# Patient Record
Sex: Female | Born: 1985 | Race: White | Hispanic: No | Marital: Married | Smoking: Never smoker
Health system: Southern US, Community
[De-identification: ages and names within clinical notes are randomized; demographics above are authoritative.]

## PROBLEM LIST (undated history)

## (undated) ENCOUNTER — Inpatient Hospital Stay (HOSPITAL_COMMUNITY): Payer: Self-pay

## (undated) DIAGNOSIS — Z789 Other specified health status: Secondary | ICD-10-CM

## (undated) DIAGNOSIS — K631 Perforation of intestine (nontraumatic): Secondary | ICD-10-CM

## (undated) DIAGNOSIS — Z9889 Other specified postprocedural states: Secondary | ICD-10-CM

## (undated) DIAGNOSIS — Z87440 Personal history of urinary (tract) infections: Secondary | ICD-10-CM

## (undated) HISTORY — DX: Personal history of urinary (tract) infections: Z87.440

---

## 2006-08-21 HISTORY — PX: APPENDECTOMY: SHX54

## 2011-10-13 ENCOUNTER — Other Ambulatory Visit (HOSPITAL_COMMUNITY): Payer: Self-pay | Admitting: Orthopedic Surgery

## 2011-10-13 DIAGNOSIS — M25561 Pain in right knee: Secondary | ICD-10-CM

## 2011-10-14 ENCOUNTER — Ambulatory Visit (HOSPITAL_COMMUNITY)
Admission: RE | Admit: 2011-10-14 | Discharge: 2011-10-14 | Disposition: A | Discharge status: Home / Self Care | Payer: 59 | Source: Ambulatory Visit | Attending: Orthopedic Surgery | Admitting: Orthopedic Surgery

## 2011-10-14 DIAGNOSIS — Y9323 Activity, snow (alpine) (downhill) skiing, snow boarding, sledding, tobogganing and snow tubing: Secondary | ICD-10-CM | POA: Insufficient documentation

## 2011-10-14 DIAGNOSIS — S82109A Unspecified fracture of upper end of unspecified tibia, initial encounter for closed fracture: Secondary | ICD-10-CM | POA: Insufficient documentation

## 2011-10-14 DIAGNOSIS — X58XXXA Exposure to other specified factors, initial encounter: Secondary | ICD-10-CM | POA: Insufficient documentation

## 2011-10-14 DIAGNOSIS — M25561 Pain in right knee: Secondary | ICD-10-CM

## 2011-10-14 DIAGNOSIS — M25469 Effusion, unspecified knee: Secondary | ICD-10-CM | POA: Insufficient documentation

## 2011-10-14 DIAGNOSIS — M7120 Synovial cyst of popliteal space [Baker], unspecified knee: Secondary | ICD-10-CM | POA: Insufficient documentation

## 2011-10-14 DIAGNOSIS — M25569 Pain in unspecified knee: Secondary | ICD-10-CM | POA: Insufficient documentation

## 2011-10-20 ENCOUNTER — Other Ambulatory Visit (HOSPITAL_COMMUNITY): Payer: Self-pay

## 2015-12-15 ENCOUNTER — Encounter (HOSPITAL_COMMUNITY): Payer: Self-pay | Admitting: *Deleted

## 2015-12-15 ENCOUNTER — Inpatient Hospital Stay (HOSPITAL_COMMUNITY)
Admission: AD | Admit: 2015-12-15 | Discharge: 2015-12-15 | Disposition: A | Discharge status: Home / Self Care | Payer: BLUE CROSS/BLUE SHIELD | Source: Ambulatory Visit | Attending: Obstetrics & Gynecology | Admitting: Obstetrics & Gynecology

## 2015-12-15 DIAGNOSIS — Z9049 Acquired absence of other specified parts of digestive tract: Secondary | ICD-10-CM | POA: Insufficient documentation

## 2015-12-15 DIAGNOSIS — O26851 Spotting complicating pregnancy, first trimester: Secondary | ICD-10-CM | POA: Insufficient documentation

## 2015-12-15 DIAGNOSIS — Z3A01 Less than 8 weeks gestation of pregnancy: Secondary | ICD-10-CM | POA: Insufficient documentation

## 2015-12-15 DIAGNOSIS — O209 Hemorrhage in early pregnancy, unspecified: Secondary | ICD-10-CM | POA: Diagnosis present

## 2015-12-15 HISTORY — DX: Other specified health status: Z78.9

## 2015-12-15 LAB — TYPE AND SCREEN
ABO/RH(D): O POS
ANTIBODY SCREEN: NEGATIVE

## 2015-12-15 LAB — ABO/RH: ABO/RH(D): O POS

## 2015-12-15 NOTE — H&P (Signed)
Chief complaint: Vaginal bleeding in pregnancy  History of present illness: 30 year old G1 at approximate [redacted] weeks gestational age by sure last menstrual period and history of normal menses who presents with brown spotting started on Friday, spotting continued slightly with some mild cramping on Saturday and woke up today passing a bright red clot. Some pink spotting since then and very minimal cramping. Patient concerned about possible miscarriage. Planned and desired first pregnancy.   Patient with prior appendectomy but no other pelvic surgeries. No history of gonorrhea Chlamydia pelvic inflammatory disease. No history of infertility.  Patient notes no chest pain, no shortness of breath no significant abdominal pain, no fevers.   Past medical history: None Past surgical history: Appendectomy Allergies: None Medications: Prenatal vitamin  Physical exam Filed Vitals:   12/15/15 0903 12/15/15 0906  BP:  123/78  Pulse:  94  Temp:  98.7 F (37.1 C)  TempSrc:  Oral  Resp:  16  Height: 5\' 7"  (1.702 m)   Weight: 58.324 kg (128 lb 9.3 oz)    Gen.: Well-appearing, no distress, slightly anxious CV: Regular rate and rhythm Pulmonary: Clear to auscultation bilaterally Back: No costovertebral angle tenderness Abdomen: Soft, nontender, nondistended, no right upper quadrant pain, no rebound, no guarding GU: Normal bilaterally of lateral labia majora and minora, normal mons normal hair distribution, normal introitus, normal perineum without swelling or rashes, normal vagina, cervix closed long, no cervical motion tenderness, uterus small and nontender, no adnexal masses or adnexal tenderness, small amount of dark brown blood in the vagina and at the cervical os Lower extremities: Nontender, no edema  Real-time transvaginal ultrasound: No adnexal masses, no free fluid, uterus with gestational yolk sac and intrauterine pregnancy with active FH. Due to my unfamiliarity with the ultrasound machine was  unable to document fetal pole size or heart rate but both appear appropriate for [redacted] weeks gestation  Type and screen: Pending  Assessment and plan: 30 year old G1 at 7 weeks by last menstrual period presents with small amount of brown spotting after passing large red clot earlier today. - Viable IUP with yolk sac fetal pole and active fetal heartbeat. Patient reassured. Patient to keep her planned follow-up in the office in one week for formal dating ultrasound.  - Type and screen pending. Will call patient later today if she is in need of program. Patient is also to follow-up with her blood type by call the office tomorrow - No evidence of anemia  Graycie Halley A. 12/15/2015 9:50 AM

## 2015-12-15 NOTE — MAU Note (Signed)
Urine sent to lab 

## 2015-12-15 NOTE — MAU Note (Signed)
Pt states the spotting started Friday.  Pt states the cramping started yesterday afternoon.  Pt states the cramping is mild.  Pt describes the spotting as dark brown and states the cramping has gotten slightly worse this morning.

## 2015-12-15 NOTE — Discharge Instructions (Signed)

## 2016-02-18 ENCOUNTER — Encounter: Payer: Self-pay | Admitting: Gastroenterology

## 2016-03-10 ENCOUNTER — Other Ambulatory Visit: Payer: Self-pay | Admitting: Gastroenterology

## 2016-03-16 ENCOUNTER — Other Ambulatory Visit: Payer: Self-pay | Admitting: Gastroenterology

## 2016-03-16 DIAGNOSIS — R109 Unspecified abdominal pain: Secondary | ICD-10-CM

## 2016-03-23 ENCOUNTER — Other Ambulatory Visit: Payer: 59

## 2016-03-27 ENCOUNTER — Ambulatory Visit
Admission: RE | Admit: 2016-03-27 | Discharge: 2016-03-27 | Disposition: A | Discharge status: Home / Self Care | Payer: BLUE CROSS/BLUE SHIELD | Source: Ambulatory Visit | Attending: Gastroenterology | Admitting: Gastroenterology

## 2016-03-27 ENCOUNTER — Encounter: Payer: Self-pay | Admitting: Radiology

## 2016-03-27 DIAGNOSIS — R109 Unspecified abdominal pain: Secondary | ICD-10-CM

## 2016-03-27 MED ORDER — IOPAMIDOL (ISOVUE-300) INJECTION 61%
100.0000 mL | Freq: Once | INTRAVENOUS | Status: AC | PRN
  Administered 2016-03-27: 100 mL via INTRAVENOUS

## 2016-04-06 ENCOUNTER — Ambulatory Visit: Payer: 59 | Admitting: Gastroenterology

## 2016-05-14 LAB — OB RESULTS CONSOLE HEPATITIS B SURFACE ANTIGEN: Hepatitis B Surface Ag: NEGATIVE

## 2016-05-14 LAB — OB RESULTS CONSOLE HIV ANTIBODY (ROUTINE TESTING): HIV: NONREACTIVE

## 2016-05-14 LAB — OB RESULTS CONSOLE RUBELLA ANTIBODY, IGM: Rubella: IMMUNE

## 2016-05-14 LAB — OB RESULTS CONSOLE RPR: RPR: NONREACTIVE

## 2016-05-27 LAB — OB RESULTS CONSOLE GC/CHLAMYDIA
Chlamydia: NEGATIVE
GC PROBE AMP, GENITAL: NEGATIVE

## 2016-09-11 ENCOUNTER — Encounter (HOSPITAL_COMMUNITY): Payer: Self-pay

## 2016-09-11 ENCOUNTER — Inpatient Hospital Stay (HOSPITAL_COMMUNITY)
Admission: AD | Admit: 2016-09-11 | Discharge: 2016-09-11 | Disposition: A | Discharge status: Home / Self Care | Payer: BLUE CROSS/BLUE SHIELD | Source: Ambulatory Visit | Attending: Obstetrics and Gynecology | Admitting: Obstetrics and Gynecology

## 2016-09-11 DIAGNOSIS — Z3A25 25 weeks gestation of pregnancy: Secondary | ICD-10-CM | POA: Insufficient documentation

## 2016-09-11 DIAGNOSIS — R1110 Vomiting, unspecified: Secondary | ICD-10-CM | POA: Diagnosis present

## 2016-09-11 DIAGNOSIS — O99612 Diseases of the digestive system complicating pregnancy, second trimester: Secondary | ICD-10-CM | POA: Insufficient documentation

## 2016-09-11 DIAGNOSIS — A084 Viral intestinal infection, unspecified: Secondary | ICD-10-CM | POA: Insufficient documentation

## 2016-09-11 MED ORDER — PROMETHAZINE HCL 25 MG PO TABS: 12.5000 mg | ORAL_TABLET | Freq: Four times a day (QID) | ORAL | 0 refills | Status: DC | PRN

## 2016-09-11 MED ORDER — PROMETHAZINE HCL 25 MG/ML IJ SOLN
25.0000 mg | Freq: Once | INTRAVENOUS | Status: AC
  Administered 2016-09-11: 25 mg via INTRAVENOUS
  Filled 2016-09-11: qty 1

## 2016-09-11 MED ORDER — ONDANSETRON 8 MG PO TBDP: 8.0000 mg | ORAL_TABLET | Freq: Three times a day (TID) | ORAL | 0 refills | Status: DC | PRN

## 2016-09-11 MED ORDER — LACTATED RINGERS IV BOLUS (SEPSIS)
1000.0000 mL | Freq: Once | INTRAVENOUS | Status: AC
  Administered 2016-09-11: 1000 mL via INTRAVENOUS

## 2016-09-11 NOTE — Discharge Instructions (Signed)

## 2016-09-11 NOTE — MAU Provider Note (Signed)
  History     CSN: [id number]  Arrival date and time: 09/11/16 [id number]   First Provider Initiated Contact with Patient 09/11/16 0231      Chief Complaint  Patient presents with  . Emesis   Lauren Zhang is a 30 y.o. G2P0010 at 1536w5d who presents today with nausea/vomiting/diarrhea. She denies any VB or LOF. She reports normal fetal movement. She denies any complications with this pregnancy. Next appointment 09/24/15.    Emesis   This is a new problem. The current episode started yesterday. The problem occurs more than 10 times per day. The problem has been unchanged. The emesis has an appearance of stomach contents. The maximum temperature recorded prior to her arrival was 100.4 - 100.9 F. The fever has been present for less than 1 day. Associated symptoms include abdominal pain. Pertinent negatives include no chills, diarrhea or fever. 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 She has tried nothing for the symptoms.   Past Medical History:  Diagnosis Date  . Medical history non-contributory     Past Surgical History:  Procedure Laterality Date  . APPENDECTOMY      History reviewed. No pertinent family history.  Social History  Substance Use Topics  . Smoking status: Never Smoker  . Smokeless tobacco: Never Used  . Alcohol use No    Allergies: No Known Allergies  Prescriptions Prior to Admission  Medication Sig Dispense Refill Last Dose  . Prenatal Vit-Fe Fumarate-FA (PRENATAL MULTIVITAMIN) TABS tablet Take 1 tablet by mouth daily at 12 noon.   12/14/2015 at Unknown time    Review of Systems  Constitutional: Negative for chills and fever.  Gastrointestinal: Positive for abdominal pain, nausea and vomiting. Negative for constipation and diarrhea.  Genitourinary: Negative for dysuria, frequency and urgency.   Physical Exam   Blood pressure 102/65, pulse 101, temperature 98.4 F (36.9 C), temperature source Oral, resp. rate 18, last menstrual period 03/15/2016, unknown if  currently breastfeeding.  Physical Exam  Nursing note and vitals reviewed. Constitutional: She is oriented to person, place, and time. She appears well-developed and well-nourished. No distress.  HENT:  Head: Normocephalic.  Cardiovascular: Normal rate.   Respiratory: Effort normal.  GI: Soft. There is no tenderness. There is no rebound.  Musculoskeletal: Normal range of motion.  Neurological: She is alert and oriented to person, place, and time.  Skin: Skin is warm and dry.  Psychiatric: She has a normal mood and affect.   FHT: 150, moderate with 10x10 accels, no decels Toco: no UCs.  MAU Course  Procedures  MDM Patient has had 1L of D5LR with phenergan and 1L of LR.  0459: D/W Dr. Billy Coastaavon, ok for DC home with phenergan and zofran.  Assessment and Plan   1. Viral gastroenteritis   2. [redacted] weeks gestation of pregnancy    DC home Comfort measures reviewed  3rd Trimester precautions  PTL precautions  Fetal kick counts RX: phenergan PRN, zofran ODT PRN  Return to MAU as needed FU with OB as planned  Follow-up Information    Lenoard AdenAAVON,[name], MD Follow up.   Specialty:  Obstetrics and Gynecology Contact information: [address] [place] [address] [phone]            Tawnya CrookHogan, Damont Balles Donovan 09/11/2016, 2:34 AM

## 2016-09-11 NOTE — MAU Note (Signed)
Patient endorses still feeling nauseous following iv fluids and medication. CNM made aware. Patient offered zofran, which she denies at this time.

## 2016-09-11 NOTE — MAU Note (Signed)
Pt presents complaining of nausea and vomiting with diarrhea since 1730 last night. Denies pain. Denies leaking or bleeding. Reports good fetal movement.

## 2016-09-17 ENCOUNTER — Encounter (HOSPITAL_COMMUNITY): Payer: Self-pay | Admitting: *Deleted

## 2016-09-17 ENCOUNTER — Inpatient Hospital Stay (HOSPITAL_COMMUNITY): Payer: BLUE CROSS/BLUE SHIELD

## 2016-09-17 ENCOUNTER — Inpatient Hospital Stay (HOSPITAL_COMMUNITY)
Admission: AD | Admit: 2016-09-17 | Discharge: 2016-09-22 | DRG: 981 | Disposition: A | Discharge status: Home Health | Payer: BLUE CROSS/BLUE SHIELD | Source: Ambulatory Visit | Attending: General Surgery | Admitting: General Surgery

## 2016-09-17 DIAGNOSIS — N1330 Unspecified hydronephrosis: Secondary | ICD-10-CM | POA: Diagnosis present

## 2016-09-17 DIAGNOSIS — O99284 Endocrine, nutritional and metabolic diseases complicating childbirth: Secondary | ICD-10-CM | POA: Diagnosis present

## 2016-09-17 DIAGNOSIS — O26892 Other specified pregnancy related conditions, second trimester: Secondary | ICD-10-CM | POA: Diagnosis present

## 2016-09-17 DIAGNOSIS — Z3A27 27 weeks gestation of pregnancy: Secondary | ICD-10-CM

## 2016-09-17 DIAGNOSIS — R109 Unspecified abdominal pain: Secondary | ICD-10-CM | POA: Diagnosis not present

## 2016-09-17 DIAGNOSIS — K668 Other specified disorders of peritoneum: Secondary | ICD-10-CM | POA: Diagnosis present

## 2016-09-17 DIAGNOSIS — K5900 Constipation, unspecified: Secondary | ICD-10-CM | POA: Diagnosis present

## 2016-09-17 DIAGNOSIS — E876 Hypokalemia: Secondary | ICD-10-CM | POA: Diagnosis not present

## 2016-09-17 DIAGNOSIS — O99612 Diseases of the digestive system complicating pregnancy, second trimester: Secondary | ICD-10-CM | POA: Diagnosis not present

## 2016-09-17 DIAGNOSIS — Z09 Encounter for follow-up examination after completed treatment for conditions other than malignant neoplasm: Secondary | ICD-10-CM

## 2016-09-17 DIAGNOSIS — K631 Perforation of intestine (nontraumatic): Secondary | ICD-10-CM | POA: Diagnosis present

## 2016-09-17 LAB — CBC
HCT: 33.9 % — ABNORMAL LOW (ref 36.0–46.0)
Hemoglobin: 12 g/dL (ref 12.0–15.0)
MCH: 31.3 pg (ref 26.0–34.0)
MCHC: 35.4 g/dL (ref 30.0–36.0)
MCV: 88.3 fL (ref 78.0–100.0)
Platelets: 270 10*3/uL (ref 150–400)
RBC: 3.84 MIL/uL — ABNORMAL LOW (ref 3.87–5.11)
RDW: 13 % (ref 11.5–15.5)
WBC: 14.4 10*3/uL — ABNORMAL HIGH (ref 4.0–10.5)

## 2016-09-17 LAB — URINALYSIS, ROUTINE W REFLEX MICROSCOPIC
BILIRUBIN URINE: NEGATIVE
Glucose, UA: NEGATIVE mg/dL
Hgb urine dipstick: NEGATIVE
KETONES UR: NEGATIVE mg/dL
LEUKOCYTES UA: NEGATIVE
NITRITE: NEGATIVE
PH: 7 (ref 5.0–8.0)
PROTEIN: NEGATIVE mg/dL
Specific Gravity, Urine: 1.006 (ref 1.005–1.030)

## 2016-09-17 MED ORDER — PROMETHAZINE HCL 25 MG/ML IJ SOLN
12.5000 mg | Freq: Once | INTRAMUSCULAR | Status: AC
  Administered 2016-09-17: 12.5 mg via INTRAMUSCULAR
  Filled 2016-09-17: qty 1

## 2016-09-17 NOTE — MAU Provider Note (Signed)
History     CSN: [id number]  Arrival date and time: 09/17/16 2038   First Provider Initiated Contact with Patient 09/17/16 2121      Chief Complaint  Patient presents with  . Abdominal Pain   Lauren Zhang is a 30 y.o. G2P0010 at [redacted]w[redacted]d who presents today with abdominal pain. She was seen here with viral gastroenteritis last week. She states that she has recovered from that, but she has noticed increasing constipation over the last few days. She went to work this morning, and she had abdominal pain. She was seen on L&D at [facility], and had an enema. She had some results, but she has continued to have pain. She was checked at [facility] this morning as well. She reports that she was told she was closed. Prior to this BM after enema today she has not had an "normal" BM in about 3 days. She usually has 2 BMs per day. She denies any contractions, vaginal bleeding or LOF. She confirms normal fetal movement.    Abdominal Pain  This is a new problem. The current episode started today. The onset quality is sudden. The problem occurs constantly. The problem has been unchanged. The pain is located in the LLQ. The pain is severe. The quality of the pain is cramping and sharp. The abdominal pain does not radiate. Nothing aggravates the pain. The pain is relieved by bowel movements. Treatments tried: enema, and laxatives  The treatment provided mild relief.   Past Medical History:  Diagnosis Date  . Medical history non-contributory     Past Surgical History:  Procedure Laterality Date  . APPENDECTOMY      History reviewed. No pertinent family history.  Social History  Substance Use Topics  . Smoking status: Never Smoker  . Smokeless tobacco: Never Used  . Alcohol use No   Allergies: No Known Allergies  Prescriptions Prior to Admission  Medication Sig Dispense Refill Last Dose  . acetaminophen (TYLENOL) 500 MG tablet Take 1,000 mg by mouth every 6 (six) hours as needed for mild pain.    09/17/2016 at Unknown time  . bisacodyl (DULCOLAX) 10 MG suppository Place 10 mg rectally once.   09/17/2016 at Unknown time  . bisacodyl (DULCOLAX) 5 MG EC tablet Take 5 mg by mouth once.   09/17/2016 at Unknown time  . polyethylene glycol (MIRALAX / GLYCOLAX) packet Take 17 g by mouth once.   09/17/2016 at Unknown time  . Prenatal Vit-Fe Fumarate-FA (PRENATAL MULTIVITAMIN) TABS tablet Take 1 tablet by mouth daily at 12 noon.   09/17/2016 at Unknown time  . ondansetron (ZOFRAN ODT) 8 MG disintegrating tablet Take 1 tablet (8 mg total) by mouth every 8 (eight) hours as needed for nausea or vomiting. (Patient not taking: Reported on 09/17/2016) 20 tablet 0 Not Taking at Unknown time  . promethazine (PHENERGAN) 25 MG tablet Take 0.5-1 tablets (12.5-25 mg total) by mouth every 6 (six) hours as needed. (Patient not taking: Reported on 09/17/2016) 30 tablet 0 Not Taking at Unknown time    Review of Systems  Gastrointestinal: Positive for abdominal pain.   Physical Exam   Blood pressure 105/61, pulse 91, temperature 98.1 F (36.7 C), temperature source Oral, resp. rate 18, height 5\' 7"  (1.702 m), weight 143 lb (64.9 kg), last menstrual period 03/15/2016, unknown if currently breastfeeding.  Physical Exam  Nursing note and vitals reviewed. Constitutional: She is oriented to person, place, and time. She appears well-developed and well-nourished. No distress.  HENT:  Head: Normocephalic.  Cardiovascular: Normal rate.   GI: Soft. There is tenderness (in LLQ ).  Hypoactive BS   Neurological: She is alert and oriented to person, place, and time.  Skin: Skin is warm and dry.  Psychiatric: She has a normal mood and affect.   Results for orders placed or performed during the hospital encounter of 09/17/16 (from the past 24 hour(s))  Urinalysis, Routine w reflex microscopic     Status: None   Collection Time: 09/17/16  8:52 PM  Result Value Ref Range   Color, Urine YELLOW YELLOW   APPearance CLEAR  CLEAR   Specific Gravity, Urine 1.006 1.005 - 1.030   pH 7.0 5.0 - 8.0   Glucose, UA NEGATIVE NEGATIVE mg/dL   Hgb urine dipstick NEGATIVE NEGATIVE   Bilirubin Urine NEGATIVE NEGATIVE   Ketones, ur NEGATIVE NEGATIVE mg/dL   Protein, ur NEGATIVE NEGATIVE mg/dL   Nitrite NEGATIVE NEGATIVE   Leukocytes, UA NEGATIVE NEGATIVE  CBC     Status: Abnormal   Collection Time: 09/17/16 11:06 PM  Result Value Ref Range   WBC 14.4 (H) 4.0 - 10.5 K/uL   RBC 3.84 (L) 3.87 - 5.11 MIL/uL   Hemoglobin 12.0 12.0 - 15.0 g/dL   HCT 78.233.9 (L) 95.636.0 - 21.346.0 %   MCV 88.3 78.0 - 100.0 fL   MCH 31.3 26.0 - 34.0 pg   MCHC 35.4 30.0 - 36.0 g/dL   RDW 08.613.0 57.811.5 - 46.915.5 %   Platelets 270 150 - 400 K/uL  Comprehensive metabolic panel     Status: Abnormal (Preliminary result)   Collection Time: 09/17/16 11:06 PM  Result Value Ref Range   Sodium 136 135 - 145 mmol/L   Potassium 3.6 3.5 - 5.1 mmol/L   Chloride 106 101 - 111 mmol/L   CO2 24 22 - 32 mmol/L   Glucose, Bld 115 (H) 65 - 99 mg/dL   BUN PENDING 6 - 20 mg/dL   Creatinine, Ser 6.290.56 0.44 - 1.00 mg/dL   Calcium 8.5 (L) 8.9 - 10.3 mg/dL   Total Protein 6.5 6.5 - 8.1 g/dL   Albumin 3.6 3.5 - 5.0 g/dL   AST 36 15 - 41 U/L   ALT 74 (H) 14 - 54 U/L   Alkaline Phosphatase 50 38 - 126 U/L   Total Bilirubin 0.4 0.3 - 1.2 mg/dL   GFR calc non Af Amer >60 >60 mL/min   GFR calc Af Amer >60 >60 mL/min   Anion gap 6 5 - 15   Dg Abdomen 1 View  Result Date: 09/17/2016 CLINICAL DATA:  Lower abdominal pain since this morning. Patient is [redacted] weeks pregnant. EXAM: ABDOMEN - 1 VIEW COMPARISON:  CT 03/27/2016 FINDINGS: Suspicious for free air under the right and and possibly left hemidiaphragm. Scattered air-fluid levels within the ascending and transverse colon. No small bowel dilatation. Stable sutures at the base of the cecum from appendectomy. Gravid uterus is noted. Normal osseous structures. IMPRESSION: Suspected free air under the right and possible left  hemidiaphragm. Scattered air-fluid levels within the colon, can be seen with diarrheal illness. No evidence of bowel obstruction. Critical Value/emergent results were called by telephone at the time of interpretation on 09/17/2016 at 11:49 pm to [facility] Bright Spielmann CNM, who verbally acknowledged these results. Electronically Signed   By: Rubye OaksMelanie  Ehinger M.D.   On: 09/17/2016 23:45   MAU Course  Procedures  MDM Patient has had enema, and reports that "the only the water came out". She ire requesting an x-ray to  look for obstruction. I d/w the patient that there are no risk factor for obstruction, and that I do not feel an x-ray is necessary. She still would like one. R/B of x-ray discussed.  2258: D/W Dr. Juliene PinaMody, will get CBC, CMET and x-ray. 2357: Dr. Juliene PinaMody notified of x-ray results. She is in the OR currently doing a c-section. Will call when done.  [id number]: Dr. Juliene PinaMody on the unit to see the patient. 0103: Dr. Juliene PinaMody on the phone with general surgery  0111: Surgery recommending MRI for further evaluation  0133: [facility] and she [facility] for [facility]. [facility] if MRI is negative. If positive then [facility] will consult with surgery. Dr. Juliene PinaMody would like to be present for surgery if surgery is done.   Dr. Juliene PinaMody cell phone: [phone]  Assessment and Plan  [facility] Will wait in ED results pending.    Tawnya CrookHogan, Lechelle Wrigley Donovan 09/17/2016, 9:40 PM

## 2016-09-17 NOTE — MAU Note (Addendum)
PT SAYS SHE WORKS  AT  [employer].      LAST WEEK -   CAME  HERE  FOR  IV  FLUIDS.     TODAY    AT WORK  HAD BAD  ABD  PAIN-    AND   WORSE THIS MORN.       GAVE HER  AN ENEMA- HAD  GOOD  RESULTS  THEN SHE  TOOK MIRILAX            TOOK AGAIN X3-        PAIN MOSTLY  ON LEFT  SIDE  OF  ABD.   ALSO  SAYS HER  RIGHT  SHOULDER  STARTED  HURTING  LASST   NIGHT

## 2016-09-18 ENCOUNTER — Encounter (HOSPITAL_COMMUNITY): Payer: Self-pay | Admitting: Radiology

## 2016-09-18 ENCOUNTER — Encounter (HOSPITAL_COMMUNITY): Admission: AD | Disposition: A | Discharge status: Home Health | Payer: Self-pay | Source: Ambulatory Visit

## 2016-09-18 ENCOUNTER — Inpatient Hospital Stay (HOSPITAL_COMMUNITY): Payer: BLUE CROSS/BLUE SHIELD

## 2016-09-18 ENCOUNTER — Inpatient Hospital Stay (HOSPITAL_COMMUNITY): Payer: BLUE CROSS/BLUE SHIELD | Admitting: Certified Registered Nurse Anesthetist

## 2016-09-18 DIAGNOSIS — K668 Other specified disorders of peritoneum: Secondary | ICD-10-CM | POA: Diagnosis present

## 2016-09-18 DIAGNOSIS — O99284 Endocrine, nutritional and metabolic diseases complicating childbirth: Secondary | ICD-10-CM | POA: Diagnosis present

## 2016-09-18 DIAGNOSIS — O99612 Diseases of the digestive system complicating pregnancy, second trimester: Secondary | ICD-10-CM | POA: Diagnosis present

## 2016-09-18 DIAGNOSIS — Z3A27 27 weeks gestation of pregnancy: Secondary | ICD-10-CM | POA: Diagnosis not present

## 2016-09-18 DIAGNOSIS — K5900 Constipation, unspecified: Secondary | ICD-10-CM | POA: Diagnosis present

## 2016-09-18 DIAGNOSIS — O26892 Other specified pregnancy related conditions, second trimester: Secondary | ICD-10-CM | POA: Diagnosis present

## 2016-09-18 DIAGNOSIS — K631 Perforation of intestine (nontraumatic): Secondary | ICD-10-CM | POA: Diagnosis present

## 2016-09-18 DIAGNOSIS — R109 Unspecified abdominal pain: Secondary | ICD-10-CM | POA: Diagnosis present

## 2016-09-18 DIAGNOSIS — N1330 Unspecified hydronephrosis: Secondary | ICD-10-CM | POA: Diagnosis present

## 2016-09-18 DIAGNOSIS — E876 Hypokalemia: Secondary | ICD-10-CM | POA: Diagnosis not present

## 2016-09-18 HISTORY — PX: COLOSTOMY: SHX63

## 2016-09-18 HISTORY — PX: LAPAROSCOPY: SHX197

## 2016-09-18 HISTORY — PX: COLON RESECTION: SHX5231

## 2016-09-18 LAB — ABO/RH: ABO/RH(D): O POS

## 2016-09-18 LAB — COMPREHENSIVE METABOLIC PANEL
ALBUMIN: 3.6 g/dL (ref 3.5–5.0)
ALT: 74 U/L — AB (ref 14–54)
ANION GAP: 6 (ref 5–15)
AST: 36 U/L (ref 15–41)
Alkaline Phosphatase: 50 U/L (ref 38–126)
BUN: <5 mg/dL — ABNORMAL LOW (ref 6–20)
CHLORIDE: 106 mmol/L (ref 101–111)
CO2: 24 mmol/L (ref 22–32)
Calcium: 8.5 mg/dL — ABNORMAL LOW (ref 8.9–10.3)
Creatinine, Ser: 0.56 mg/dL (ref 0.44–1.00)
GFR calc non Af Amer: >60 mL/min (ref >60)
GLUCOSE: 115 mg/dL — AB (ref 65–99)
Potassium: 3.6 mmol/L (ref 3.5–5.1)
SODIUM: 136 mmol/L (ref 135–145)
Total Bilirubin: 0.4 mg/dL (ref 0.3–1.2)
Total Protein: 6.5 g/dL (ref 6.5–8.1)

## 2016-09-18 LAB — MRSA PCR SCREENING: MRSA BY PCR: NEGATIVE

## 2016-09-18 LAB — TYPE AND SCREEN
ABO/RH(D): O POS
ANTIBODY SCREEN: NEGATIVE

## 2016-09-18 SURGERY — LAPAROSCOPY, DIAGNOSTIC
Anesthesia: General | Site: Abdomen

## 2016-09-18 MED ORDER — SUCCINYLCHOLINE CHLORIDE 20 MG/ML IJ SOLN
INTRAMUSCULAR | Status: DC | PRN
  Administered 2016-09-18: 100 mg via INTRAVENOUS

## 2016-09-18 MED ORDER — PRENATAL MULTIVITAMIN CH
1.0000 | ORAL_TABLET | Freq: Every day | ORAL | Status: DC
  Administered 2016-09-19 – 2016-09-21 (×2): 1 via ORAL
  Filled 2016-09-18 (×5): qty 1

## 2016-09-18 MED ORDER — HYDROMORPHONE HCL 2 MG/ML IJ SOLN
1.0000 mg | Freq: Once | INTRAMUSCULAR | Status: AC
  Administered 2016-09-18: 1 mg via INTRAVENOUS
  Filled 2016-09-18: qty 1

## 2016-09-18 MED ORDER — EPHEDRINE 5 MG/ML INJ
INTRAVENOUS | Status: AC
  Filled 2016-09-18: qty 20

## 2016-09-18 MED ORDER — PANTOPRAZOLE SODIUM 40 MG IV SOLR
40.0000 mg | Freq: Two times a day (BID) | INTRAVENOUS | Status: DC
  Administered 2016-09-18 – 2016-09-20 (×4): 40 mg via INTRAVENOUS
  Filled 2016-09-18 (×4): qty 40

## 2016-09-18 MED ORDER — NEOSTIGMINE METHYLSULFATE 10 MG/10ML IV SOLN
INTRAVENOUS | Status: DC | PRN
Start: 2016-09-18 — End: 2016-09-18
  Administered 2016-09-18: 4 mg via INTRAVENOUS

## 2016-09-18 MED ORDER — LIDOCAINE HCL (CARDIAC) 20 MG/ML IV SOLN
INTRAVENOUS | Status: DC | PRN
  Administered 2016-09-18: 40 mg via INTRAVENOUS

## 2016-09-18 MED ORDER — FENTANYL CITRATE (PF) 100 MCG/2ML IJ SOLN
INTRAMUSCULAR | Status: AC
  Administered 2016-09-18: 50 ug via INTRAVENOUS
  Filled 2016-09-18: qty 2

## 2016-09-18 MED ORDER — BUPIVACAINE HCL (PF) 0.5 % IJ SOLN
INTRAMUSCULAR | Status: AC
  Filled 2016-09-18: qty 30

## 2016-09-18 MED ORDER — PIPERACILLIN-TAZOBACTAM 3.375 G IVPB 30 MIN
3.3750 g | INTRAVENOUS | Status: AC
  Administered 2016-09-18: 3.375 g via INTRAVENOUS
  Filled 2016-09-18: qty 50

## 2016-09-18 MED ORDER — IOPAMIDOL (ISOVUE-300) INJECTION 61%
INTRAVENOUS | Status: AC
  Filled 2016-09-18: qty 100

## 2016-09-18 MED ORDER — PIPERACILLIN-TAZOBACTAM 3.375 G IVPB
3.3750 g | Freq: Three times a day (TID) | INTRAVENOUS | Status: DC
  Administered 2016-09-18 – 2016-09-22 (×11): 3.375 g via INTRAVENOUS
  Filled 2016-09-18 (×14): qty 50

## 2016-09-18 MED ORDER — NEOSTIGMINE METHYLSULFATE 5 MG/5ML IV SOSY
PREFILLED_SYRINGE | INTRAVENOUS | Status: AC
  Filled 2016-09-18: qty 5

## 2016-09-18 MED ORDER — FAMOTIDINE IN NACL 20-0.9 MG/50ML-% IV SOLN
20.0000 mg | Freq: Once | INTRAVENOUS | Status: AC
  Administered 2016-09-18: 20 mg via INTRAVENOUS
  Filled 2016-09-18: qty 50

## 2016-09-18 MED ORDER — LIDOCAINE 2% (20 MG/ML) 5 ML SYRINGE
INTRAMUSCULAR | Status: AC
  Filled 2016-09-18: qty 10

## 2016-09-18 MED ORDER — PHENYLEPHRINE HCL 10 MG/ML IJ SOLN
INTRAMUSCULAR | Status: DC | PRN
Start: 2016-09-18 — End: 2016-09-18
  Administered 2016-09-18: 120 ug via INTRAVENOUS
  Administered 2016-09-18 (×2): 80 ug via INTRAVENOUS

## 2016-09-18 MED ORDER — HYDROMORPHONE HCL 1 MG/ML IJ SOLN
1.0000 mg | Freq: Once | INTRAMUSCULAR | Status: AC
  Administered 2016-09-18: 1 mg via INTRAVENOUS

## 2016-09-18 MED ORDER — ONDANSETRON HCL 4 MG/2ML IJ SOLN
INTRAMUSCULAR | Status: AC
  Administered 2016-09-18: 4 mg
  Filled 2016-09-18: qty 2

## 2016-09-18 MED ORDER — HYDROMORPHONE HCL 1 MG/ML IJ SOLN: 0.5000 mg | INTRAMUSCULAR | Status: DC | PRN

## 2016-09-18 MED ORDER — FENTANYL CITRATE (PF) 100 MCG/2ML IJ SOLN
INTRAMUSCULAR | Status: AC
  Filled 2016-09-18: qty 2

## 2016-09-18 MED ORDER — SUCCINYLCHOLINE CHLORIDE 200 MG/10ML IV SOSY
PREFILLED_SYRINGE | INTRAVENOUS | Status: AC
  Filled 2016-09-18: qty 10

## 2016-09-18 MED ORDER — PROMETHAZINE HCL 25 MG/ML IJ SOLN
12.5000 mg | Freq: Four times a day (QID) | INTRAMUSCULAR | Status: DC | PRN
  Administered 2016-09-18 – 2016-09-19 (×2): 12.5 mg via INTRAVENOUS
  Filled 2016-09-18 (×2): qty 1

## 2016-09-18 MED ORDER — ROCURONIUM BROMIDE 50 MG/5ML IV SOSY
PREFILLED_SYRINGE | INTRAVENOUS | Status: AC
  Filled 2016-09-18: qty 10

## 2016-09-18 MED ORDER — 0.9 % SODIUM CHLORIDE (POUR BTL) OPTIME
TOPICAL | Status: DC | PRN
  Administered 2016-09-18 (×2): 1000 mL

## 2016-09-18 MED ORDER — GLYCOPYRROLATE 0.2 MG/ML IJ SOLN
INTRAMUSCULAR | Status: DC | PRN
  Administered 2016-09-18: .8 mg via INTRAVENOUS

## 2016-09-18 MED ORDER — SODIUM CHLORIDE 0.9 % IV SOLN: INTRAVENOUS | Status: DC

## 2016-09-18 MED ORDER — LACTATED RINGERS IV BOLUS (SEPSIS)
500.0000 mL | Freq: Once | INTRAVENOUS | Status: AC
  Administered 2016-09-18: 500 mL via INTRAVENOUS

## 2016-09-18 MED ORDER — PHENYLEPHRINE 40 MCG/ML (10ML) SYRINGE FOR IV PUSH (FOR BLOOD PRESSURE SUPPORT)
PREFILLED_SYRINGE | INTRAVENOUS | Status: AC
  Filled 2016-09-18: qty 10

## 2016-09-18 MED ORDER — ONDANSETRON HCL 4 MG/2ML IJ SOLN: 4.0000 mg | Freq: Once | INTRAMUSCULAR | Status: DC | PRN

## 2016-09-18 MED ORDER — BUPIVACAINE LIPOSOME 1.3 % IJ SUSP
20.0000 mL | INTRAMUSCULAR | Status: DC
  Filled 2016-09-18: qty 20

## 2016-09-18 MED ORDER — HYDROMORPHONE HCL 1 MG/ML IJ SOLN
1.0000 mg | INTRAMUSCULAR | Status: DC | PRN
  Administered 2016-09-18 – 2016-09-20 (×15): 1 mg via INTRAVENOUS
  Filled 2016-09-18 (×15): qty 1

## 2016-09-18 MED ORDER — FENTANYL CITRATE (PF) 100 MCG/2ML IJ SOLN
25.0000 ug | INTRAMUSCULAR | Status: DC | PRN
  Administered 2016-09-18 (×4): 50 ug via INTRAVENOUS

## 2016-09-18 MED ORDER — ACETAMINOPHEN 500 MG PO TABS
1000.0000 mg | ORAL_TABLET | Freq: Four times a day (QID) | ORAL | Status: DC | PRN
  Administered 2016-09-19 (×2): 1000 mg via ORAL
  Administered 2016-09-21: 500 mg via ORAL
  Filled 2016-09-18 (×3): qty 2

## 2016-09-18 MED ORDER — FENTANYL CITRATE (PF) 100 MCG/2ML IJ SOLN
INTRAMUSCULAR | Status: DC | PRN
  Administered 2016-09-18 (×3): 50 ug via INTRAVENOUS
  Administered 2016-09-18: 100 ug via INTRAVENOUS
  Administered 2016-09-18: 150 ug via INTRAVENOUS

## 2016-09-18 MED ORDER — LACTATED RINGERS IV SOLN
INTRAVENOUS | Status: DC
Start: 2016-09-18 — End: 2016-09-19
  Administered 2016-09-18: 19:00:00 via INTRAVENOUS

## 2016-09-18 MED ORDER — FENTANYL CITRATE (PF) 100 MCG/2ML IJ SOLN
INTRAMUSCULAR | Status: AC
  Filled 2016-09-18: qty 4

## 2016-09-18 MED ORDER — SUGAMMADEX SODIUM 200 MG/2ML IV SOLN
INTRAVENOUS | Status: AC
  Filled 2016-09-18: qty 2

## 2016-09-18 MED ORDER — LACTATED RINGERS IV SOLN
INTRAVENOUS | Status: DC | PRN
  Administered 2016-09-18: 13:00:00 via INTRAVENOUS

## 2016-09-18 MED ORDER — EVICEL 5 ML EX KIT
PACK | CUTANEOUS | Status: AC
  Filled 2016-09-18: qty 1

## 2016-09-18 MED ORDER — ROCURONIUM BROMIDE 100 MG/10ML IV SOLN
INTRAVENOUS | Status: DC | PRN
  Administered 2016-09-18: 30 mg via INTRAVENOUS
  Administered 2016-09-18: 20 mg via INTRAVENOUS

## 2016-09-18 MED ORDER — HYDROMORPHONE HCL 1 MG/ML IJ SOLN
1.0000 mg | INTRAMUSCULAR | Status: DC | PRN
  Administered 2016-09-18: 1 mg via INTRAVENOUS
  Filled 2016-09-18 (×2): qty 1

## 2016-09-18 MED ORDER — LACTATED RINGERS IV SOLN
INTRAVENOUS | Status: DC
  Administered 2016-09-18 (×2): via INTRAVENOUS

## 2016-09-18 MED ORDER — FENTANYL CITRATE (PF) 100 MCG/2ML IJ SOLN
50.0000 ug | INTRAMUSCULAR | Status: AC | PRN
  Administered 2016-09-18 (×4): 50 ug via INTRAVENOUS

## 2016-09-18 MED ORDER — ONDANSETRON HCL 4 MG/2ML IJ SOLN
INTRAMUSCULAR | Status: AC
  Filled 2016-09-18: qty 4

## 2016-09-18 MED ORDER — ONDANSETRON HCL 4 MG/2ML IJ SOLN: 4.0000 mg | Freq: Four times a day (QID) | INTRAMUSCULAR | Status: DC | PRN

## 2016-09-18 MED ORDER — DEXTROSE 5 % IV SOLN: INTRAVENOUS | Status: DC | PRN

## 2016-09-18 MED ORDER — ONDANSETRON HCL 4 MG/2ML IJ SOLN
INTRAMUSCULAR | Status: AC
  Filled 2016-09-18: qty 2

## 2016-09-18 MED ORDER — BUPIVACAINE HCL (PF) 0.5 % IJ SOLN
INTRAMUSCULAR | Status: DC | PRN
  Administered 2016-09-18: 6 mL

## 2016-09-18 MED ORDER — PROPOFOL 10 MG/ML IV BOLUS
INTRAVENOUS | Status: AC
  Filled 2016-09-18: qty 20

## 2016-09-18 MED ORDER — BUPIVACAINE HCL (PF) 0.25 % IJ SOLN
INTRAMUSCULAR | Status: AC
  Filled 2016-09-18: qty 30

## 2016-09-18 MED ORDER — IOPAMIDOL (ISOVUE-300) INJECTION 61%
INTRAVENOUS | Status: AC
  Administered 2016-09-18: 80 mL
  Filled 2016-09-18: qty 30

## 2016-09-18 MED ORDER — PHENYLEPHRINE HCL 10 MG/ML IJ SOLN
INTRAVENOUS | Status: DC | PRN
  Administered 2016-09-18: 20 ug/min via INTRAVENOUS

## 2016-09-18 MED ORDER — SODIUM CHLORIDE 0.9 % IR SOLN
Status: DC | PRN
  Administered 2016-09-18: 1000 mL

## 2016-09-18 MED ORDER — PROPOFOL 10 MG/ML IV BOLUS
INTRAVENOUS | Status: DC | PRN
  Administered 2016-09-18: 180 mg via INTRAVENOUS

## 2016-09-18 SURGICAL SUPPLY — 77 items
APPLIER CLIP ROT 10 11.4 M/L (STAPLE)
BENZOIN TINCTURE PRP APPL 2/3 (GAUZE/BANDAGES/DRESSINGS) IMPLANT
BLADE SURG 10 STRL SS (BLADE) ×3 IMPLANT
BLADE SURG ROTATE 9660 (MISCELLANEOUS) IMPLANT
CANISTER SUCTION 2500CC (MISCELLANEOUS) IMPLANT
CHLORAPREP W/TINT 26ML (MISCELLANEOUS) ×3 IMPLANT
CLIP APPLIE ROT 10 11.4 M/L (STAPLE) IMPLANT
COVER SURGICAL LIGHT HANDLE (MISCELLANEOUS) ×3 IMPLANT
CUTTER FLEX LINEAR 45M (STAPLE) ×3 IMPLANT
DECANTER SPIKE VIAL GLASS SM (MISCELLANEOUS) ×3 IMPLANT
DERMABOND ADVANCED (GAUZE/BANDAGES/DRESSINGS) ×1
DERMABOND ADVANCED .7 DNX12 (GAUZE/BANDAGES/DRESSINGS) ×2 IMPLANT
DRAPE LAPAROSCOPIC ABDOMINAL (DRAPES) IMPLANT
DRAPE WARM FLUID 44X44 (DRAPE) ×3 IMPLANT
DRSG OPSITE POSTOP 4X10 (GAUZE/BANDAGES/DRESSINGS) IMPLANT
DRSG OPSITE POSTOP 4X8 (GAUZE/BANDAGES/DRESSINGS) IMPLANT
ELECT BLADE 6.5 EXT (BLADE) IMPLANT
ELECT CAUTERY BLADE 6.4 (BLADE) ×3 IMPLANT
ELECT REM PT RETURN 9FT ADLT (ELECTROSURGICAL) ×3
ELECTRODE REM PT RTRN 9FT ADLT (ELECTROSURGICAL) ×2 IMPLANT
ENDOLOOP SUT PDS II  0 18 (SUTURE)
ENDOLOOP SUT PDS II 0 18 (SUTURE) IMPLANT
GLOVE BIO SURGEON STRL SZ7 (GLOVE) ×6 IMPLANT
GLOVE BIOGEL PI IND STRL 6.5 (GLOVE) ×2 IMPLANT
GLOVE BIOGEL PI IND STRL 7.0 (GLOVE) ×2 IMPLANT
GLOVE BIOGEL PI IND STRL 8 (GLOVE) ×2 IMPLANT
GLOVE BIOGEL PI INDICATOR 6.5 (GLOVE) ×1
GLOVE BIOGEL PI INDICATOR 7.0 (GLOVE) ×1
GLOVE BIOGEL PI INDICATOR 8 (GLOVE) ×1
GLOVE ECLIPSE 6.0 STRL STRAW (GLOVE) ×3 IMPLANT
GLOVE ECLIPSE 7.5 STRL STRAW (GLOVE) ×3 IMPLANT
GOWN STRL REUS W/ TWL LRG LVL3 (GOWN DISPOSABLE) ×4 IMPLANT
GOWN STRL REUS W/ TWL XL LVL3 (GOWN DISPOSABLE) ×2 IMPLANT
GOWN STRL REUS W/TWL LRG LVL3 (GOWN DISPOSABLE) ×2
GOWN STRL REUS W/TWL XL LVL3 (GOWN DISPOSABLE) ×1
KIT BASIN OR (CUSTOM PROCEDURE TRAY) ×3 IMPLANT
KIT OSTOMY DRAINABLE 2.75 STR (WOUND CARE) ×3 IMPLANT
KIT ROOM TURNOVER OR (KITS) ×3 IMPLANT
LIGASURE IMPACT 36 18CM CVD LR (INSTRUMENTS) IMPLANT
NEEDLE INSUFFLATION 14GA 120MM (NEEDLE) IMPLANT
NS IRRIG 1000ML POUR BTL (IV SOLUTION) ×6 IMPLANT
PACK GENERAL/GYN (CUSTOM PROCEDURE TRAY) IMPLANT
PAD ARMBOARD 7.5X6 YLW CONV (MISCELLANEOUS) ×6 IMPLANT
PENCIL BUTTON HOLSTER BLD 10FT (ELECTRODE) ×3 IMPLANT
POUCH SPECIMEN RETRIEVAL 10MM (ENDOMECHANICALS) IMPLANT
RELOAD STAPLE TA45 3.5 REG BLU (ENDOMECHANICALS) ×3 IMPLANT
SET IRRIG TUBING LAPAROSCOPIC (IRRIGATION / IRRIGATOR) ×3 IMPLANT
SHEARS HARMONIC ACE PLUS 36CM (ENDOMECHANICALS) ×3 IMPLANT
SLEEVE ENDOPATH XCEL 5M (ENDOMECHANICALS) ×3 IMPLANT
SPECIMEN JAR LARGE (MISCELLANEOUS) ×3 IMPLANT
SPECIMEN JAR SMALL (MISCELLANEOUS) IMPLANT
SPONGE LAP 18X18 X RAY DECT (DISPOSABLE) IMPLANT
STAPLER VISISTAT 35W (STAPLE) IMPLANT
SUCTION POOLE TIP (SUCTIONS) IMPLANT
SUT MNCRL AB 4-0 PS2 18 (SUTURE) ×3 IMPLANT
SUT MON AB 5-0 PS2 18 (SUTURE) IMPLANT
SUT NOVA 1 T20/GS 25DT (SUTURE) IMPLANT
SUT NOVA NAB GS-21 0 18 T12 DT (SUTURE) IMPLANT
SUT PDS AB 1 CTX 36 (SUTURE) IMPLANT
SUT SILK 2 0 (SUTURE) ×1
SUT SILK 2 0 SH CR/8 (SUTURE) IMPLANT
SUT SILK 2 0 TIES 10X30 (SUTURE) IMPLANT
SUT SILK 2-0 18XBRD TIE 12 (SUTURE) ×2 IMPLANT
SUT SILK 3 0 SH CR/8 (SUTURE) IMPLANT
SUT SILK 3 0 TIES 10X30 (SUTURE) IMPLANT
SUT VIC AB 3-0 SH 18 (SUTURE) ×6 IMPLANT
TOWEL OR 17X24 6PK STRL BLUE (TOWEL DISPOSABLE) ×3 IMPLANT
TOWEL OR 17X26 10 PK STRL BLUE (TOWEL DISPOSABLE) IMPLANT
TRAY FOLEY CATH 14FRSI W/METER (CATHETERS) IMPLANT
TRAY FOLEY CATH 16FRSI W/METER (SET/KITS/TRAYS/PACK) ×3 IMPLANT
TRAY LAPAROSCOPIC MC (CUSTOM PROCEDURE TRAY) ×3 IMPLANT
TROCAR XCEL 12X100 BLDLESS (ENDOMECHANICALS) ×3 IMPLANT
TROCAR XCEL BLUNT TIP 100MML (ENDOMECHANICALS) ×3 IMPLANT
TROCAR XCEL NON-BLD 5MMX100MML (ENDOMECHANICALS) ×3 IMPLANT
TUBING INSUFFLATION (TUBING) IMPLANT
WATER STERILE IRR 1000ML POUR (IV SOLUTION) IMPLANT
YANKAUER SUCT BULB TIP NO VENT (SUCTIONS) IMPLANT

## 2016-09-18 NOTE — ED Provider Notes (Signed)
Blood pressure 97/63, pulse 72, temperature 98.2 F (36.8 C), temperature source Oral, resp. rate 16, height 5\' 7"  (1.702 m), weight 143 lb (64.9 kg), last menstrual period 03/15/2016, SpO2 96 %, unknown if currently breastfeeding.  Assuming care from Dr. Blinda LeatherwoodPollina.  In short, Lauren Zhang is a 30 y.o. female with a chief complaint of Abdominal Pain .  Refer to the original H&P for additional details.  The current plan of care is to follow CT abdomen/pelvis and reassess.  08:02 AM Spoke with day radiologist regarding the CT scan selection. His opinion was that CT would not add significant value in terms of identifying free air over a lateral decubitus plain film. We can start with lateral decubitus film and then progress to CT PRN.   Spoke with Radiology regarding plain film which shows free air. Will proceed with CT. The Radiologist came to discuss the case with the patient and updated me by phone. Appreciate assistance with case.   09:26 AM CT shows small-volume free air with no clear source. Will discuss case with Ob/Gyn on call.   10:08 AM Discussed case with general surgery who will be down for admission. Updated Dr. Juliene PinaMody with [facility] by phone who agrees [facility] with diagnosis of free air. Updated the patient and husband at bedside.   [name], MD Emergency Medicine   Maia PlanJoshua G Charan Prieto, MD 09/18/16 1010

## 2016-09-18 NOTE — ED Notes (Signed)
Pt transported to MRI 

## 2016-09-18 NOTE — ED Notes (Signed)
Pt transported to CT ?

## 2016-09-18 NOTE — Progress Notes (Addendum)
[facility] to see a G2 P0 26.5 weeks who is being seeing for abdominal pain, pt is being evaluated by [facility], pt has free air in her abdomen and will be going to surgery. Called Dr Billy Coastaavon to make him aware, to have surgeon call dr Billy Coasttaavon. Will do pre and post op NST. PT states baby is moving well, denies any leaking or bleeding.

## 2016-09-18 NOTE — Progress Notes (Signed)
Patient ID: Lauren Zhang, female   DOB: 10/11/1985, 30 y.o.   MRN: [id number] Received call from Dr Johna SheriffHoxworth. He recommends surgical evaluation.We have Rapid response RN there for peri-operative maternal/fetal monitoring. He does not recommend giving Betamethasone for fetal lung maturity with possible bowel perf and possible peritonitis. Ob service will continue to follow and when stable from surgical standpoint will discuss [facility]. Will inform Dr Seymour BarsLavoie as she starts call at 1 pm for the weekend.   Hilary Hertz-V.Itzabella Sorrels, MD

## 2016-09-18 NOTE — ED Notes (Signed)
OB Rapid Response RN at bedside. 

## 2016-09-18 NOTE — H&P (Signed)
Patient was interviewed and examined in the emergency department with Will Marlyne BeardsJennings PA. I have personally reviewed all of her imaging. Please see his full note for complete details.  She has a 24-hour history of progressive diffuse abdominal pain. On questioning this seemed to initially start on the left lower quadrant.  She does have a previous history of some issues with constipation and has previously had a colonoscopy due to some left lower quadrant discomfort. After the onset of the pain she was evaluated at maternity admissions. She had an enema which caused immediate worsening of her pain. She has had Dilaudid in the emergency department and states that except for this the pain would be intolerable.  Examination shows a somewhat anxious Caucasian female. Abdomen consistent with [redacted] week gestation uterus. There is moderate diffuse tenderness not localized. No other palpable masses.  Assessment and plan: Acute diffuse abdominal pain in this otherwise healthy 30 year old pregnant 3327 weeks gestational age. Her history and onset of the pain as well as exacerbation with the enema suggest possibly a colon perforation. Multiple other possibilities including peptic ulcer, small bowel diverticulum etc. Imaging studies do not help in localizing the source.  I discussed options with the patient and her husband. I discussed potential diagnoses including "benign" free air such as transdiaphragmatic air from the lungs. I think this is very unlikely based on the significance of her abdominal pain and tenderness. This is almost certainly a GI source. I discussed options of close monitoring with IV antibiotics and nothing by mouth status versus diagnostic laparoscopy and intervention based on findings. As she appears somewhat ill with significant tenderness and pain and elevated white blood count and I think that observation alone is too high risk. I discussed diagnostic laparoscopy. We discussed that there is  significant risk for inducing labor and loss of the fetus. Ongoing peritonitis however would also be likely the cause labor. We discussed that if a minimal colon perforation is found that it might be treated with lavage closure and drainage but if there is fecal contamination she could end up with a resection, open surgery and a colostomy. I have discussed this case with a couple of my partners who concur. I discussed the case in detail with her obstetrician Dr. Rosemary Holmsavon. They will be following the patient closely perioperatively. She and her husband agree to proceed with surgery.  She is receiving broad spectrum preoperative IV antibiotics.   Mariella SaaBenjamin T Osias Resnick MD, FACS  09/18/2016, 11:49 AM

## 2016-09-18 NOTE — Consult Note (Signed)
Phone call to OR around 13:55:  Called Dr Johna SheriffHoxworth in OR to inform him that I will be the Ob on call responsible for Lauren Zhang from now until Sunday at 5 pm .   I was informed of the OR findings and planned procedure:  Perforated Sigmoid Colon for Sigmoid Colon Resection by LPS and Colostomy.  I offered Ob assistance if needed.  No Ob concern during surgery, so assistance declined by Dr Johna SheriffHoxworth.  I will follow patient post op as the Ob consultant this week-end.  Will monitor with FHT and make sure no PTL complicates the Postop course.  Cell phone:  [phone]  Lauren DelMarie-Lyne Charnice Zwilling MD  09/18/2016 at 16:04

## 2016-09-18 NOTE — Anesthesia Procedure Notes (Signed)
Procedure Name: Intubation Date/Time: 09/18/2016 12:56 PM Performed by: Faustino CongressWHITE, Yordan Martindale TENA Jecenia Leamer Pre-anesthesia Checklist: Patient identified, Emergency Drugs available, Suction available and Patient being monitored Patient Re-evaluated:Patient Re-evaluated prior to inductionOxygen Delivery Method: Circle System Utilized Preoxygenation: Pre-oxygenation with 100% oxygen Intubation Type: IV induction, Rapid sequence and Cricoid Pressure applied Laryngoscope Size: Glidescope and 3 Grade View: Grade I Tube type: Oral Tube size: 7.0 mm Number of attempts: 1 Airway Equipment and Method: Stylet Placement Confirmation: ETT inserted through vocal cords under direct vision,  positive ETCO2 and breath sounds checked- equal and bilateral Secured at: 21 cm Tube secured with: Tape Dental Injury: Teeth and Oropharynx as per pre-operative assessment  Comments: Elective glidescope due to 27 weeks pregnancy

## 2016-09-18 NOTE — Op Note (Signed)
Preoperative Diagnosis: Perforated viscus  Postoprative Diagnosis: Perforation of sigmoid colon  Procedure: Procedure(s): DIAGNOSTIC LAPAROSCOPY LAPAROSCOPIC SIGMOID COLON RESECTION COLOSTOMY  Gertie Gowda(Hartmann procedure)   Surgeon: Glenna FellowsHoxworth, Deloma Spindle T   Assistants: Frederik SchmidtJay Wyatt  Anesthesia:  General endotracheal anesthesia  Indications: Patient is a generally healthy 30 year old female who presents with 24 hours of worsening generalized abdominal pain. Plain films and CT scan has shown a small to moderate amount of free peritoneal air but no obvious source of perforation. She does have a history of some left lower quadrant abdominal pain and constipation several years ago and underwent colonoscopy with findings not known but she says it looked "okay". She did have severe exacerbation of her pain when she underwent an enema in the maternal admissions unit. After discussion regarding options of nonoperative and operative care with the patient and her husband and after discussion with her obstetrician with elected to proceed with diagnostic laparoscopy to determine the area of perforation with possible laparotomy. I discussed various possible findings with the patient and possible need for laparotomy or resection and colostomy. Risks of the procedure were discussed in detail including premature labor and potential loss of her pregnancy. They understand and agree to proceed.    Procedure Detail:  Patient was brought to the operating room, placed in the supine position on the operating table, and general endotracheal anesthesia induced. Foley catheter was placed. PAS were in place. The abdomen was widely sterilely prepped and draped. She received preoperative broad-spectrum IV antibiotics. Patient timeout was performed and correct procedure verified. Due to the gravid uterus which extended to just below the umbilicus I used a 1 cm incision in the midline above the umbilicus and dissection was carried down  through the subcutaneous tissue and midline fascia and the peritoneum entered under direct vision. Through a mattress suture of 0 Vicryl the Hassan trocar was inserted and pneumoperitoneum established. There was noted to be a moderate amount of purulent fluid over the liver and in both paracolic gutters. Moderate peritonitis and some areas of exudate. 2 additional 5 mm trochars were placed in the left upper quadrant and left mid abdomen. I carefully examined the stomach and duodenum which appeared completely normal. Loops of small bowel had some secondary exudate but were normal. There was increased exudate and inflammatory adhesions just posterior to the dome of the uterus and as we pulled back some omentum and small bowel loops we came across a severely inflamed segment of sigmoid colon. This was able to be mobilized up from some inflammatory adhesions and there was about a 5-6 cm segment of marked erythema and thickening with exudate and a lot of edema in the wall with possibly some fluid or air in the wall. We were suspicious of one area of possible perforation but there was not a clear hole in the colon or fecal contamination. Her sigmoid colon was very redundant with a lot of mobility proximally. The distal sigmoid and rectosigmoid as far as we could see behind the uterus appeared completely normal. Dr. Lindie SpruceWyatt and I discussed and considered options of lavage and observation but we both felt that the area sigmoid appeared so abnormal that we were concerned there would be ongoing contamination from the area and we elect to proceed with resection and ostomy.  Fortunately this was in a very mobile segment of midsigmoid colon. Initially the sigmoid colon proximal to the diseased segment was mobilized dividing lateral peritoneal attachments with very good proximal mobility. A point for distal resection was chosen.  A point for the ostomy in the left mid abdomen above the umbilicus was chosen and under direct vision a  12 mm port was placed here. The point of distal resection was cleared of mesentery just behind the bowel wall and then divided with a single firing of the Endo GIA 45 mm blue load stapler. The staple line was intact and without bleeding. The mesentery of the involved segment of sigmoid was then sequentially divided with the Harmonic scalpel working back toward the proximal sigmoid.  I then excised a skin and subcutaneous button around the 12 mm trocar, incised the anterior fascia in a cruciate fashion and then removed the trocar with the end of the bowel grasped and the diseased segment was brought out through the colostomy site and more proximal normal sigmoid colon easily brought up as an ostomy without any tension. The abdomen was then carefully inspected for bleeding and to make sure the ostomy was not twisted and everything looked fine. The abdomen was thoroughly irrigated with saline until clear. Following this all trochars were removed and CO2 evacuated. The mattress suture was secured at the supraumbilical incision. The colon was divided in healthy bowel proximal to the diseased segment which was sent for permanent pathology. The end ostomy was matured with interrupted 3-0 Vicryl. The trocar sites were closed with subcuticular Monocryl and Dermabond. Sponge needle and instrument counts were correct.    Findings: As above  Estimated Blood Loss:  less than 50 mL         Drains: None  Blood Given: none          Specimens: Sigmoid colon        Complications:  * No complications entered in OR log *         Disposition: PACU - hemodynamically stable.         Condition: stable

## 2016-09-18 NOTE — H&P (Signed)
Lauren Zhang is an 30 y.o. female.    Chief Complaint: Abdominal pain  HPI: Patient is a 30 year old female who reports an episode of GI virus 1 week ago. She says she hasn't been right since that time. She was having significant nausea, vomiting, and diarrhea with the virus. She was treated at [facility] with some additional IV fluids during this illness last week. She says she has not felt good since that time. She was seen again yesterday at [facility], it was their opinion she was constipated and was treated with what sounds like a fleets enema. She had some results from this and felt somewhat better initially. She continued to have pain and was told to take MiraLAX at home. She took 3 doses and noted the pain became worse. She also took a suppository. She went to [facility] last evening around 8:30 PM. He was treated with a soapsuds enema. Shortly after that she had worsening pain, extending to her chest. She was ultimately treated with Dilaudid. Plain films were obtained last evening at 1137 which was suggestive of free air under the right and possibly the left hemidiaphragm. She was transferred to [facility] and underwent MR of the abdomen. There are no acute findings on the MR. It was recommended she obtain a CT scan which is more sensitive for free air. CT scan was read at 54 this a.m. This shows a small volume pneumoperitoneum no definite source. Her abdomen uterus with a second single intrauterine pregnancy in mild bilateral hydroureter nephrosis. She is status post appendectomy. She is afebrile vital signs are stable in the ER. Labs obtained last evening at 2306 shows a white count of 14,400 hemoglobin and hematocrit platelets are normal. CMP is essentially normal. Urinalysis was normal. Emergency department called general surgery to evaluate the patient after the CT was reviewed.     Past Medical History:  Diagnosis Date   Miscarriage March 2017  Colonoscopy for possible IBS  Dr. Paulita Fujita earlier this year.      Past Surgical History:  Procedure Laterality Date  . APPENDECTOMY  2007    History reviewed. No pertinent family history. Social History:  reports that she has never smoked. She has never used smokeless tobacco. She reports that she does not drink alcohol or use drugs. ETOH: social prior to pregnancy Tobacco:  None Drugs: None Married husband is with her.  She is a Immunologist.  Allergies: No Known Allergies   Prior to Admission medications   Medication Sig Start Date End Date Taking? Authorizing Provider  acetaminophen (TYLENOL) 500 MG tablet Take 1,000 mg by mouth every 6 (six) hours as needed for mild pain.   Yes Historical Provider, MD  bisacodyl (DULCOLAX) 10 MG suppository Place 10 mg rectally once.   Yes Historical Provider, MD  bisacodyl (DULCOLAX) 5 MG EC tablet Take 5 mg by mouth once.   Yes Historical Provider, MD  polyethylene glycol (MIRALAX / GLYCOLAX) packet Take 17 g by mouth once.   Yes Historical Provider, MD  Prenatal Vit-Fe Fumarate-FA (PRENATAL MULTIVITAMIN) TABS tablet Take 1 tablet by mouth daily at 12 noon.   Yes Historical Provider, MD  ondansetron (ZOFRAN ODT) 8 MG disintegrating tablet Take 1 tablet (8 mg total) by mouth every 8 (eight) hours as needed for nausea or vomiting. Patient not taking: Reported on 09/17/2016 09/11/16   Tresea Mall, CNM  promethazine (PHENERGAN) 25 MG tablet Take 0.5-1 tablets (12.5-25 mg total) by mouth every 6 (six) hours as needed. Patient  not taking: Reported on 09/17/2016 09/11/16   Tresea Mall, CNM     (Not in a hospital admission)  Results for orders placed or performed during the hospital encounter of 09/17/16 (from the past 48 hour(s))  Urinalysis, Routine w reflex microscopic     Status: None   Collection Time: 09/17/16  8:52 PM  Result Value Ref Range   Color, Urine YELLOW YELLOW   APPearance CLEAR CLEAR   Specific Gravity, Urine 1.006 1.005 - 1.030   pH 7.0 5.0 - 8.0    Glucose, UA NEGATIVE NEGATIVE mg/dL   Hgb urine dipstick NEGATIVE NEGATIVE   Bilirubin Urine NEGATIVE NEGATIVE   Ketones, ur NEGATIVE NEGATIVE mg/dL   Protein, ur NEGATIVE NEGATIVE mg/dL   Nitrite NEGATIVE NEGATIVE   Leukocytes, UA NEGATIVE NEGATIVE  CBC     Status: Abnormal   Collection Time: 09/17/16 11:06 PM  Result Value Ref Range   WBC 14.4 (H) 4.0 - 10.5 K/uL   RBC 3.84 (L) 3.87 - 5.11 MIL/uL   Hemoglobin 12.0 12.0 - 15.0 g/dL   HCT 33.9 (L) 36.0 - 46.0 %   MCV 88.3 78.0 - 100.0 fL   MCH 31.3 26.0 - 34.0 pg   MCHC 35.4 30.0 - 36.0 g/dL   RDW 13.0 11.5 - 15.5 %   Platelets 270 150 - 400 K/uL  Comprehensive metabolic panel     Status: Abnormal   Collection Time: 09/17/16 11:06 PM  Result Value Ref Range   Sodium 136 135 - 145 mmol/L   Potassium 3.6 3.5 - 5.1 mmol/L   Chloride 106 101 - 111 mmol/L   CO2 24 22 - 32 mmol/L   Glucose, Bld 115 (H) 65 - 99 mg/dL   BUN <5 (L) 6 - 20 mg/dL    Comment: REPEATED TO VERIFY   Creatinine, Ser 0.56 0.44 - 1.00 mg/dL   Calcium 8.5 (L) 8.9 - 10.3 mg/dL   Total Protein 6.5 6.5 - 8.1 g/dL   Albumin 3.6 3.5 - 5.0 g/dL   AST 36 15 - 41 U/L   ALT 74 (H) 14 - 54 U/L   Alkaline Phosphatase 50 38 - 126 U/L   Total Bilirubin 0.4 0.3 - 1.2 mg/dL   GFR calc non Af Amer >60 >60 mL/min   GFR calc Af Amer >60 >60 mL/min    Comment: (NOTE) The eGFR has been calculated using the CKD EPI equation. This calculation has not been validated in all clinical situations. eGFR's persistently <60 mL/min signify possible Chronic Kidney Disease.    Anion gap 6 5 - 15   Dg Abdomen 1 View  Result Date: 09/17/2016 CLINICAL DATA:  Lower abdominal pain since this morning. Patient is [redacted] weeks pregnant. EXAM: ABDOMEN - 1 VIEW COMPARISON:  CT 03/27/2016 FINDINGS: Suspicious for free air under the right and and possibly left hemidiaphragm. Scattered air-fluid levels within the ascending and transverse colon. No small bowel dilatation. Stable sutures at the base of  the cecum from appendectomy. Gravid uterus is noted. Normal osseous structures. IMPRESSION: Suspected free air under the right and possible left hemidiaphragm. Scattered air-fluid levels within the colon, can be seen with diarrheal illness. No evidence of bowel obstruction. Critical Value/emergent results were called by telephone at the time of interpretation on 09/17/2016 at 11:49 pm to [place], who verbally acknowledged these results. Electronically Signed   By: Jeb Levering M.D.   On: 09/17/2016 23:45   Mr Abdomen Wo Contrast  Result Date: 09/18/2016 CLINICAL  DATA:  Sudden onset abdominal pain. Free air seen on radiograph. EXAM: MRI ABDOMEN WITHOUT CONTRAST TECHNIQUE: Multiplanar multisequence MR imaging was performed without the administration of intravenous contrast. COMPARISON:  Abdominal radiograph 09/17/2016 FINDINGS: Lower chest: No abnormality identified. Hepatobiliary: No mass or other parenchymal abnormality identified. No biliary dilatation. Pancreas: Pancreatic contours are normal. No peripancreatic fluid collection. No pancreatic ductal dilatation. Spleen:  Normal. Adrenals/Urinary Tract: The right adrenal gland is normal. The left adrenal gland is not clearly seen. There is moderate right hydronephrosis. The left kidney is normal. Stomach/Bowel: There is no dilated bowel. No focal bowel wall thickening. There is trace fluid within the left pericolic gutter but no well-defined fluid collection. MRI is not sensitive for the detection of small volume free air. Vascular/Lymphatic: The course and caliber of the major vessels of the abdomen are normal. Expected flow voids are maintained. No abdominal lymphadenopathy. Other: There is a singleton intrauterine pregnancy. This study is not optimized for evaluation of the fetus. Musculoskeletal: No focal marrow lesion. IMPRESSION: 1. No dilated small bowel or fluid collection within the abdomen. MRI is insensitive for the detection of small  volume free air. Given the free air seen on the comparison radiograph, CT of the abdomen and pelvis may be warranted to evaluate for possible bowel perforation. At [redacted] weeks gestational age, the fetus is beyond the most radiation sensitive period. 2. Moderate right hydronephrosis of pregnancy. The findings were discussed with Dr. Benjie Karvonen by Dr. Marisue Humble at 4:28 AM on 09/18/2016. Electronically Signed   By: Ulyses Jarred M.D.   On: 09/18/2016 05:05   Ct Abdomen Pelvis W Contrast  Result Date: 09/18/2016 CLINICAL DATA:  29 year old female with sudden onset of diffuse abdominal pain at 1 a.m. today. Acute onset of pain approximately 15 minutes after completion of an enema. Abdominal tenderness on examination. No associated nausea or vomiting. EXAM: CT ABDOMEN AND PELVIS WITH CONTRAST TECHNIQUE: Multidetector CT imaging of the abdomen and pelvis was performed using the standard protocol following bolus administration of intravenous contrast. CONTRAST:  79m ISOVUE-300 IOPAMIDOL (ISOVUE-300) INJECTION 61% COMPARISON:  CT the abdomen and pelvis 03/27/2016. FINDINGS: Lower chest: Unremarkable. Hepatobiliary: No cystic or solid hepatic lesions. No intra or extrahepatic biliary ductal dilatation. Gallbladder is normal in appearance. Pancreas: No pancreatic mass. No pancreatic ductal dilatation. No pancreatic or peripancreatic fluid or inflammatory changes. Spleen: Unremarkable. Adrenals/Urinary Tract: Mild bilateral hydroureteronephrosis, presumably related to extrinsic compression on the ureters from the gravid uterus. Bilateral kidneys and bilateral adrenal glands are otherwise unremarkable in appearance. Urinary bladder is unremarkable in appearance. Stomach/Bowel: The appearance of the stomach is normal. There is no pathologic dilatation of small bowel or colon. Oral contrast material has traversed the small bowel, with a small amount of contrast material extending into the cecum. No definite extravasation of oral  contrast material is noted on today's examination. Status post appendectomy. Vascular/Lymphatic: No significant atherosclerotic disease, aneurysm or dissection identified in the abdominal or pelvic vasculature. No lymphadenopathy noted in the abdomen or pelvis. Reproductive: Gravid uterus and single IUP noted. Placenta is predominantly fundal and posterior in location. Ovaries are unremarkable in appearance. Other: Small volume of pneumoperitoneum noted throughout the upper abdomen adjacent to the spleen, posterior to the right lobe of the liver, adjacent to the falciform ligament, and beneath both of the hemidiaphragms. Trace ascites. No significant volume of ascites. Musculoskeletal: There are no aggressive appearing lytic or blastic lesions noted in the visualized portions of the skeleton. IMPRESSION: 1. Small volume of pneumoperitoneum, as above.  No definite source confidently identified on today's examination. 2. Gravid uterus with single IUP which is causing some mild compression of the distal ureters bilaterally resulting in some mild bilateral hydroureteronephrosis. 3. Status post appendectomy. Critical Value/emergent results were called by telephone at the time of interpretation on 09/18/2016 at 9:22 am to Dr. Nanda Quinton, who verbally acknowledged these results. Electronically Signed   By: Vinnie Langton M.D.   On: 09/18/2016 09:23   Dg Abd Decub  Result Date: 09/18/2016 CLINICAL DATA:  30 year old pregnant female with acute onset of abdominal pain. Possible pneumoperitoneum noted on prior abdominal radiograph. EXAM: ABDOMEN - 1 VIEW DECUBITUS COMPARISON:  Abdominal radiograph 09/09/2016. FINDINGS: Lower abdomen and pelvis is shielded by a lead apron, preventing visualization. Within the upper abdomen there is a small volume of nondependent free air noted adjacent to the liver. Oral contrast material is noted within the small bowel. No pathologic dilatation of bowel is noted. Some air-fluid levels  are noted within small bowel loops. IMPRESSION: 1. Study is positive for small volume of pneumoperitoneum. Critical Value/emergent results were called by telephone at the time of interpretation on 09/18/2016 at 8:34 am to Dr. Nanda Quinton, who verbally acknowledged these results. Electronically Signed   By: Vinnie Langton M.D.   On: 09/18/2016 08:34    Review of Systems  Constitutional: Negative for chills, diaphoresis, fever, malaise/fatigue and weight loss.  HENT: Negative.   Eyes: Negative.   Respiratory: Negative.   Cardiovascular: Positive for chest pain (After enema last p.m.). Negative for palpitations, orthopnea, claudication, leg swelling and PND.  Gastrointestinal: Positive for abdominal pain, constipation, heartburn (Occasional) and nausea (Nausea last p.m. but none today.). Negative for blood in stool, diarrhea, melena and vomiting.  Genitourinary: Negative.        She is 26 weeks and 5 days into her pregnancy. Has had no issues with this pregnancy.  Musculoskeletal: Negative.   Skin: Negative.   Neurological: Negative.  Negative for weakness.  Endo/Heme/Allergies: Negative.   Psychiatric/Behavioral: Negative.     Blood pressure 103/57, pulse 81, temperature 98.2 F (36.8 C), temperature source Oral, resp. rate 16, height 5' 7"  (1.702 m), weight 64.9 kg (143 lb), last menstrual period 03/15/2016, SpO2 96 %, unknown if currently breastfeeding. Physical Exam  Constitutional: She is oriented to person, place, and time. She appears well-developed and well-nourished. No distress.  HENT:  Head: Normocephalic and atraumatic.  Mouth/Throat: No oropharyngeal exudate.  Eyes: Right eye exhibits no discharge. Left eye exhibits no discharge. No scleral icterus.  Neck: Normal range of motion. Neck supple. No JVD present. No tracheal deviation present. No thyromegaly present.  Cardiovascular: Normal rate, regular rhythm, normal heart sounds and intact distal pulses.   No murmur  heard. Respiratory: Effort normal and breath sounds normal. No respiratory distress. She has no wheezes. She has no rales. She exhibits no tenderness.  GI: She exhibits distension. There is tenderness. There is guarding.  Patient is [redacted] weeks pregnant with palpable fundus. She is tender all over. She notes the pain started on the left side was more severe there initially. Currently she has pain on the right side also. Tender to palpation and percussion in her chest for her to move or sit up.  Genitourinary:  Genitourinary Comments: 26 weeks and 7 days pregnant with fundus palpable.  Baby is active on exam  Musculoskeletal: She exhibits no edema or tenderness.  Lymphadenopathy:    She has no cervical adenopathy.  Neurological: She is alert and oriented to person,  place, and time. No cranial nerve deficit.  Skin: Skin is warm and dry. No rash noted. She is not diaphoretic. No erythema. No pallor.  Psychiatric: She has a normal mood and affect. Her behavior is normal. Judgment and thought content normal.     Assessment/Plan Free intra-abdominal air of uncertain etiology. 26 weeks and 5 days gestation   Plan: Patient seen and evaluated by Dr. Excell Seltzer. He has reviewed this with Dr. Lucia Gaskins and is discussing with Dr. Oran Rein; her current obstetrics/gynecology physicians. It is his opinion she is very tender and she would best served by exploratory laparoscopy to determine and treat the source of her free air.  He discussed the risk and benefits with her and her husband.  They are in agreement.  Rosena Bartle, PA-C 09/18/2016, 10:08 AM

## 2016-09-18 NOTE — ED Provider Notes (Signed)
[facility]-EMERGENCY DEPT Provider Note   CSN: [id number] Arrival date & time: 09/17/16  2038  By signing my name below, I, Clovis PuAvnee Patel, attest that this documentation has been prepared under the direction and in the presence of Gilda Creasehristopher J Lezette Kitts, MD  Electronically Signed: Clovis PuAvnee Patel, ED Scribe. 09/18/16. 3:12 AM.  History   Chief Complaint Chief Complaint  Patient presents with  . Abdominal Pain   The history is provided by the patient. No language interpreter was used.   HPI Comments:  Lauren Zhang is a 30 y.o. female who presents to the Emergency Department complaining of sudden onset, moderate abdominal pain which began in the AM yesterday. [facility] and was given an enema with temporary relief. She states her pain returned and [facility] and was given another enema with no relief. She also reports sharp, burning pain to her central chest after the enema which lasted for minutes and self-resolved. Pt denies any other associated symptoms and any other modifying factors at this time.   Past Medical History:  Diagnosis Date  . Medical history non-contributory     There are no active problems to display for this patient.   Past Surgical History:  Procedure Laterality Date  . APPENDECTOMY      OB History    Gravida Para Term Preterm AB Living   2       1     SAB TAB Ectopic Multiple Live Births   1               Home Medications    Prior to Admission medications   Medication Sig Start Date End Date Taking? Authorizing Provider  acetaminophen (TYLENOL) 500 MG tablet Take 1,000 mg by mouth every 6 (six) hours as needed for mild pain.   Yes Historical Provider, MD  bisacodyl (DULCOLAX) 10 MG suppository Place 10 mg rectally once.   Yes Historical Provider, MD  bisacodyl (DULCOLAX) 5 MG EC tablet Take 5 mg by mouth once.   Yes Historical Provider, MD  polyethylene glycol (MIRALAX / GLYCOLAX) packet Take 17 g by mouth  once.   Yes Historical Provider, MD  Prenatal Vit-Fe Fumarate-FA (PRENATAL MULTIVITAMIN) TABS tablet Take 1 tablet by mouth daily at 12 noon.   Yes Historical Provider, MD  ondansetron (ZOFRAN ODT) 8 MG disintegrating tablet Take 1 tablet (8 mg total) by mouth every 8 (eight) hours as needed for nausea or vomiting. Patient not taking: Reported on 09/17/2016 09/11/16   Armando ReichertHeather D Hogan, CNM  promethazine (PHENERGAN) 25 MG tablet Take 0.5-1 tablets (12.5-25 mg total) by mouth every 6 (six) hours as needed. Patient not taking: Reported on 09/17/2016 09/11/16   Armando ReichertHeather D Hogan, CNM    Family History History reviewed. No pertinent family history.  Social History Social History  Substance Use Topics  . Smoking status: Never Smoker  . Smokeless tobacco: Never Used  . Alcohol use No     Allergies   Patient has no known allergies.   Review of Systems Review of Systems  Constitutional: Negative for fever.  Gastrointestinal: Positive for abdominal pain and constipation.  All other systems reviewed and are negative.  Physical Exam Updated Vital Signs BP 97/63   Pulse 72   Temp 98.2 F (36.8 C) (Oral)   Resp 16   Ht 5\' 7"  (1.702 m)   Wt 143 lb (64.9 kg)   LMP 03/15/2016   SpO2 96%   BMI 22.40 kg/m  Physical Exam  Constitutional: She is oriented to person, place, and time. She appears well-developed and well-nourished. No distress.  HENT:  Head: Normocephalic and atraumatic.  Right Ear: Hearing normal.  Left Ear: Hearing normal.  Nose: Nose normal.  Mouth/Throat: Oropharynx is clear and moist and mucous membranes are normal.  Eyes: Conjunctivae and EOM are normal. Pupils are equal, round, and reactive to light.  Neck: Normal range of motion. Neck supple.  Cardiovascular: Regular rhythm, S1 normal and S2 normal.  Exam reveals no gallop and no friction rub.   No murmur heard. Pulmonary/Chest: Effort normal and breath sounds normal. No respiratory distress. She exhibits no  tenderness.  Abdominal: Soft. Normal appearance and bowel sounds are normal. There is no hepatosplenomegaly. There is tenderness. There is no rebound, no guarding, no tenderness at McBurney's point and negative Murphy's sign. No hernia.  Diffuse abdominal tenderness.   Musculoskeletal: Normal range of motion.  Neurological: She is alert and oriented to person, place, and time. She has normal strength. No cranial nerve deficit or sensory deficit. Coordination normal. GCS eye subscore is 4. GCS verbal subscore is 5. GCS motor subscore is 6.  Skin: Skin is warm, dry and intact. No rash noted. No cyanosis.  Psychiatric: She has a normal mood and affect. Her speech is normal and behavior is normal. Thought content normal.  Nursing note and vitals reviewed.    ED Treatments / Results  DIAGNOSTIC STUDIES:  Oxygen Saturation is 97% on RA, normal by my interpretation.    COORDINATION OF CARE:  3:06 AM Discussed treatment plan with pt at bedside and pt agreed to plan.  Labs (all labs ordered are listed, but only abnormal results are displayed) Labs Reviewed  CBC - Abnormal; Notable for the following:       Result Value   WBC 14.4 (*)    RBC 3.84 (*)    HCT 33.9 (*)    All other components within normal limits  COMPREHENSIVE METABOLIC PANEL - Abnormal; Notable for the following:    Glucose, Bld 115 (*)    BUN <5 (*)    Calcium 8.5 (*)    ALT 74 (*)    All other components within normal limits  URINALYSIS, ROUTINE W REFLEX MICROSCOPIC    EKG  EKG Interpretation None       Radiology Dg Abdomen 1 View  Result Date: 09/17/2016 CLINICAL DATA:  Lower abdominal pain since this morning. Patient is [redacted] weeks pregnant. EXAM: ABDOMEN - 1 VIEW COMPARISON:  CT 03/27/2016 FINDINGS: Suspicious for free air under the right and and possibly left hemidiaphragm. Scattered air-fluid levels within the ascending and transverse colon. No small bowel dilatation. Stable sutures at the base of the cecum  from appendectomy. Gravid uterus is noted. Normal osseous structures. IMPRESSION: Suspected free air under the right and possible left hemidiaphragm. Scattered air-fluid levels within the colon, can be seen with diarrheal illness. No evidence of bowel obstruction. Critical Value/emergent results were called by telephone at the time of interpretation on 09/17/2016 at 11:49 pm to [facility] CNM, who verbally acknowledged these results. Electronically Signed   By: Rubye OaksMelanie  Ehinger M.D.   On: 09/17/2016 23:45   Mr Abdomen Wo Contrast  Result Date: 09/18/2016 CLINICAL DATA:  Sudden onset abdominal pain. Free air seen on radiograph. EXAM: MRI ABDOMEN WITHOUT CONTRAST TECHNIQUE: Multiplanar multisequence MR imaging was performed without the administration of intravenous contrast. COMPARISON:  Abdominal radiograph 09/17/2016 FINDINGS: Lower chest: No abnormality identified. Hepatobiliary: No mass or other  parenchymal abnormality identified. No biliary dilatation. Pancreas: Pancreatic contours are normal. No peripancreatic fluid collection. No pancreatic ductal dilatation. Spleen:  Normal. Adrenals/Urinary Tract: The right adrenal gland is normal. The left adrenal gland is not clearly seen. There is moderate right hydronephrosis. The left kidney is normal. Stomach/Bowel: There is no dilated bowel. No focal bowel wall thickening. There is trace fluid within the left pericolic gutter but no well-defined fluid collection. MRI is not sensitive for the detection of small volume free air. Vascular/Lymphatic: The course and caliber of the major vessels of the abdomen are normal. Expected flow voids are maintained. No abdominal lymphadenopathy. Other: There is a singleton intrauterine pregnancy. This study is not optimized for evaluation of the fetus. Musculoskeletal: No focal marrow lesion. IMPRESSION: 1. No dilated small bowel or fluid collection within the abdomen. MRI is insensitive for the detection of small volume free  air. Given the free air seen on the comparison radiograph, CT of the abdomen and pelvis may be warranted to evaluate for possible bowel perforation. At [redacted] weeks gestational age, the fetus is beyond the most radiation sensitive period. 2. Moderate right hydronephrosis of pregnancy. The findings were discussed with Dr. Juliene Pina by Dr. Manus Gunning at 4:28 AM on 09/18/2016. Electronically Signed   By: Deatra Robinson M.D.   On: 09/18/2016 05:05    Procedures Procedures (including critical care time)  Medications Ordered in ED Medications  lactated ringers infusion ( Intravenous New Bag/Given 09/18/16 0524)  HYDROmorphone (DILAUDID) injection 1 mg (1 mg Intravenous Given 09/18/16 0132)  iopamidol (ISOVUE-300) 61 % injection (not administered)  iopamidol (ISOVUE-300) 61 % injection (not administered)  HYDROmorphone (DILAUDID) injection 1 mg (not administered)  promethazine (PHENERGAN) injection 12.5 mg (12.5 mg Intramuscular Given 09/17/16 2257)  lactated ringers bolus 500 mL (500 mLs Intravenous Given 09/18/16 0121)  famotidine (PEPCID) IVPB 20 mg premix (20 mg Intravenous Given 09/18/16 0132)  HYDROmorphone (DILAUDID) injection 1 mg (1 mg Intravenous Given 09/18/16 0223)  HYDROmorphone (DILAUDID) injection 1 mg (1 mg Intravenous Given 09/18/16 0537)     Initial Impression / Assessment and Plan / ED Course  I have reviewed the triage vital signs and the nursing notes.  Pertinent labs & imaging results that were available during my care of the patient were reviewed by me and considered in my medical decision making (see chart for details).  Clinical Course    [facility]. Patient had been treated for gastroenteritis last week, never fully recovered. She started to feel constipated and has been seen twice by OB/GYN for this. She was administered enemas and MiraLAX. She presented to [facility] hospital tonight and had an x-ray which  was suspicious for possible free air. She was transferred here for MRI for further evaluation. MRI was performed but did not delineate whether there was an acute process in the abdomen. It was recommended she undergo CT scan with IV and oral contrast. This was discussed at length with the patient. Risks and benefits of radiation versus missing acute surgical abdomen were discussed. She understands the risks of radiation and did give consent to the procedure. This was also recommended by Dr. Juliene Pina, [facility].  Final Clinical Impressions(s) / ED Diagnoses   Final diagnoses:  Abdominal pain, unspecified abdominal location    New Prescriptions New Prescriptions   No medications on file  I personally performed the services described in this documentation, which was scribed in my presence. The recorded information has been  reviewed and is accurate.     Gilda Crease, MD 09/18/16 [phone]

## 2016-09-18 NOTE — Progress Notes (Signed)
PICU was notified to be on standby for this 30 y/o pregnant female undergoing general anesthesia and exp lap for free air.  Pt is [redacted] wks pregnant.  I met parents in preop and introduced myself and Alyssa RN who has prior NICU training.  OB-Gyn, RT, and NICU teams notified of possible need for emergent delivery.  Our team would plan to stabilize and manage infant until arrival of NICU team if needed.

## 2016-09-18 NOTE — Transfer of Care (Signed)
Immediate Anesthesia Transfer of Care Note  Patient: Lauren Zhang  Procedure(s) Performed: Procedure(s): DIAGNOSTIC LAPAROSCOPY (N/A) LAPAROSCOPIC SIGMOID COLON RESECTION (N/A) COLOSTOMY (N/A)  Patient Location: PACU  Anesthesia Type:General  Level of Consciousness: awake, alert , oriented and patient cooperative  Airway & Oxygen Therapy: Patient Spontanous Breathing and Patient connected to nasal cannula oxygen  Post-op Assessment: Report given to RN and Post -op Vital signs reviewed and stable  Post vital signs: Reviewed and stable  Last Vitals:  Vitals:   09/18/16 1045 09/18/16 1500  BP: 105/74   Pulse: 82   Resp: 16   Temp:  37.1 C    Last Pain:  Vitals:   09/18/16 1500  TempSrc:   PainSc: 7          Complications: No apparent anesthesia complications

## 2016-09-18 NOTE — Progress Notes (Signed)
Called Dr Billy Coastaavon informed of post op reassuring fetal heart tracing, orders received to do NST qday while pt in hospital.

## 2016-09-18 NOTE — ED Notes (Signed)
Contacted CT - aware pt has finished oral contrast. Will come transport pt to scan next.

## 2016-09-18 NOTE — Anesthesia Preprocedure Evaluation (Signed)
Anesthesia Evaluation   Patient awake    Reviewed: Allergy & Precautions, NPO status , Patient's Chart, lab work & pertinent test results  Airway Mallampati: II  TM Distance: >3 FB Neck ROM: Full    Dental  (+) Teeth Intact, Dental Advisory Given   Pulmonary    breath sounds clear to auscultation       Cardiovascular  Rhythm:Regular Rate:Normal     Neuro/Psych    GI/Hepatic   Endo/Other    Renal/GU      Musculoskeletal   Abdominal   Peds  Hematology   Anesthesia Other Findings   Reproductive/Obstetrics                             Anesthesia Physical Anesthesia Plan  ASA: II and emergent  Anesthesia Plan: General   Post-op Pain Management:    Induction: Intravenous, Rapid sequence and Cricoid pressure planned  Airway Management Planned: Oral ETT  Additional Equipment:   Intra-op Plan:   Post-operative Plan: Extubation in OR  Informed Consent: I have reviewed the patients History and Physical, chart, labs and discussed the procedure including the risks, benefits and alternatives for the proposed anesthesia with the patient or authorized representative who has indicated his/her understanding and acceptance.   Dental advisory given  Plan Discussed with: CRNA and Anesthesiologist  Anesthesia Plan Comments:         Anesthesia Quick Evaluation

## 2016-09-18 NOTE — MAU Provider Note (Signed)
On call Ob MD.  Pt seen at 1 am but d/w MAU provider earlier, findings discussed, Plain film abdomen and CBC/ CMP reviewed. Agree with note by MAU provider.  30 yo female [redacted] wks pregnant, presented to MAU with severe abdominal pain, shooting pain in her epigastrium and to her back and shoulder. 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 Pain got worse after minimal success with bowel movement so [facility]. She feels distended and cannot touch her belly. She denies fever/ chills or vomiting but has nausea. She hs IBS but denies Peptic ulcer diease/ Inflammatory bowel diease. She had viral gastroenteritis 1 week back for which she received IVfluids and has not felt normal GI function since.  She had a colonoscopy earlier this year for IBS by Dr Dulce Sellarutlaw and was clear, she does not have diverticulosis. She is s/p Appendectomy. She does not have h/o gall stones or stomach ulcers.   BP 105/61 (BP Location: Right Arm)   Pulse 91   Temp 98.1 F (36.7 C) (Oral)   Resp 18   Ht 5\' 7"  (1.702 m)   Wt 143 lb (64.9 kg)   LMP 03/15/2016   BMI 22.40 kg/m   Peripheral pulses normal and good volume. Pt appears in pain and distressed Abdomen - gravid uterus, mild tenderness all over the abdomen. Hypoactive bowel sounds. No rebound   Pregnancy evaluation- Uncomplicated pregnancy, PNCare with Dr Billy Coastaavon.                                       FHT appropriately reactive and stable for [redacted]wk gestation, baseline at 150s/ appropriate 10- beat variability/ no decels.                                       No contractions noted, Uterus is soft and relaxed.   Xray abdo- "suspicious for free air under right hemidiaphragm and possible also under left hemidiaphragm", several air fluid levels seen, no small bowel obstruction noted.   CBC    Component Value Date/Time   WBC 14.4  (H) 09/17/2016 2306   RBC 3.84 (L) 09/17/2016 2306   HGB 12.0 09/17/2016 2306   HCT 33.9 (L) 09/17/2016 2306   PLT 270 09/17/2016 2306   MCV 88.3 09/17/2016 2306   MCH 31.3 09/17/2016 2306   MCHC 35.4 09/17/2016 2306   RDW 13.0 09/17/2016 2306   CMP Latest Ref Rng & Units 09/17/2016  Glucose 65 - 99 mg/dL 130(Q115(H)  BUN 6 - 20 mg/dL <6(V<5(L)  Creatinine 7.840.44 - 1.00 mg/dL 6.960.56  Sodium 295135 - 284145 mmol/L 136  Potassium 3.5 - 5.1 mmol/L 3.6  Chloride 101 - 111 mmol/L 106  CO2 22 - 32 mmol/L 24  Calcium 8.9 - 10.3 mg/dL 1.3(K8.5(L)  Total Protein 6.5 - 8.1 g/dL 6.5  Total Bilirubin 0.3 - 1.2 mg/dL 0.4  Alkaline Phos 38 - 126 U/L 50  AST 15 - 41 U/L 36  ALT 14 - 54 U/L 74(H)   A/P:  Acute abdominal pain, suspicious for bowel perforation, though patient appears stable but in pain.  Xray reviewed on phone with on-call surgeon Dr Michaell CowingGross due to :suspicious for free  air under diaphragm" and discussed labs and vitals. He recommends getting stat MRI as plain abdo films are not very reliable. [facility], hold patient there until MRI read, if suspicious plan Gen Surg eval there and if clear, [facility].  IVFluids 500 cc LR, then 125 cc/hr, Dilaudid 1 mg, Pepcid IV. Strict NPO.  Plan reviewed with patient and her husband and mother, all agree.  Transport and MRI being arranged by RNs.  V.Jaisha Villacres, MD

## 2016-09-19 LAB — BASIC METABOLIC PANEL
Anion gap: 10 (ref 5–15)
BUN: 5 mg/dL — ABNORMAL LOW (ref 6–20)
CHLORIDE: 102 mmol/L (ref 101–111)
CO2: 23 mmol/L (ref 22–32)
CREATININE: 0.7 mg/dL (ref 0.44–1.00)
Calcium: 8.1 mg/dL — ABNORMAL LOW (ref 8.9–10.3)
GFR calc non Af Amer: >60 mL/min (ref >60)
Glucose, Bld: 80 mg/dL (ref 65–99)
POTASSIUM: 3.4 mmol/L — AB (ref 3.5–5.1)
Sodium: 135 mmol/L (ref 135–145)

## 2016-09-19 LAB — CBC
HEMATOCRIT: 28.7 % — AB (ref 36.0–46.0)
HEMOGLOBIN: 9.7 g/dL — AB (ref 12.0–15.0)
MCH: 30.3 pg (ref 26.0–34.0)
MCHC: 33.8 g/dL (ref 30.0–36.0)
MCV: 89.7 fL (ref 78.0–100.0)
Platelets: 230 10*3/uL (ref 150–400)
RBC: 3.2 MIL/uL — ABNORMAL LOW (ref 3.87–5.11)
RDW: 12.9 % (ref 11.5–15.5)
WBC: 11.9 10*3/uL — AB (ref 4.0–10.5)

## 2016-09-19 LAB — MAGNESIUM: MAGNESIUM: 1.6 mg/dL — AB (ref 1.7–2.4)

## 2016-09-19 MED ORDER — PROMETHAZINE HCL 25 MG/ML IJ SOLN: 6.2500 mg | Freq: Four times a day (QID) | INTRAMUSCULAR | Status: DC | PRN

## 2016-09-19 MED ORDER — KCL-LACTATED RINGERS-D5W 20 MEQ/L IV SOLN
INTRAVENOUS | Status: DC
  Administered 2016-09-19 (×2): via INTRAVENOUS
  Filled 2016-09-19 (×3): qty 1000

## 2016-09-19 NOTE — Anesthesia Postprocedure Evaluation (Signed)
Anesthesia Post Note  Patient: Richardean Salemily Accardo  Procedure(s) Performed: Procedure(s) (LRB): DIAGNOSTIC LAPAROSCOPY (N/A) LAPAROSCOPIC SIGMOID COLON RESECTION (N/A) COLOSTOMY (N/A)  Patient location during evaluation: PACU Anesthesia Type: General Level of consciousness: awake and alert Pain management: pain level controlled Vital Signs Assessment: post-procedure vital signs reviewed and stable Respiratory status: spontaneous breathing, nonlabored ventilation, respiratory function stable and patient connected to nasal cannula oxygen Cardiovascular status: blood pressure returned to baseline and stable Postop Assessment: no signs of nausea or vomiting Anesthetic complications: no       Last Vitals:  Vitals:   09/19/16 0758 09/19/16 1200  BP: (!) 103/45 113/69  Pulse: 77 81  Resp: 17 16  Temp: 36.8 C 36.7 C    Last Pain:  Vitals:   09/19/16 1200  TempSrc: Oral  PainSc:                  Docie Abramovich

## 2016-09-19 NOTE — Progress Notes (Signed)
1 Day Post-Op  Subjective: She looks good, ostomy is a bit edematous, just some sweat so far.  Sites look fine she is uncomfortable, but has been up to the chair x 1.  She want to walk when her husband comes back.    Objective: Vital signs in last 24 hours: Temp:  [98.1 F (36.7 C)-98.7 F (37.1 C)] 98.2 F (36.8 C) (12/30 0758) Pulse Rate:  [65-101] 77 (12/30 0758) Resp:  [10-21] 17 (12/30 0758) BP: (100-127)/(45-79) 103/45 (12/30 0758) SpO2:  [91 %-100 %] 91 % (12/30 0758)    PO 100 IV 2400 Urine 865 Stool - 0 Afebrile, VSS Labs OK, K+ down some; H/H down some, WBC improving Intake/Output from previous day: 12/29 0701 - 12/30 0700 In: 2510 [P.O.:100; I.V.:2310; IV Piggyback:100] Out: 915 [Urine:865; Blood:50] Intake/Output this shift: No intake/output data recorded.  General appearance: alert, cooperative and no distress Resp: clear to auscultation bilaterally GI: no BS, sites look good, ostomy looks fine.  just sweat in the bag.   Lab Results:   Recent Labs  09/17/16 2306 09/19/16 0307  WBC 14.4* 11.9*  HGB 12.0 9.7*  HCT 33.9* 28.7*  PLT 270 230    BMET  Recent Labs  09/17/16 2306 09/19/16 0307  NA 136 135  K 3.6 3.4*  CL 106 102  CO2 24 23  GLUCOSE 115* 80  BUN <5* 5*  CREATININE 0.56 0.70  CALCIUM 8.5* 8.1*   PT/INR No results for input(s): LABPROT, INR in the last 72 hours.   Recent Labs Lab 09/17/16 2306  AST 36  ALT 74*  ALKPHOS 50  BILITOT 0.4  PROT 6.5  ALBUMIN 3.6     Lipase  No results found for: LIPASE   Studies/Results: Dg Abdomen 1 View  Result Date: 09/17/2016 CLINICAL DATA:  Lower abdominal pain since this morning. Patient is [redacted] weeks pregnant. EXAM: ABDOMEN - 1 VIEW COMPARISON:  CT 03/27/2016 FINDINGS: Suspicious for free air under the right and and possibly left hemidiaphragm. Scattered air-fluid levels within the ascending and transverse colon. No small bowel dilatation. Stable sutures at the base of the cecum from  appendectomy. Gravid uterus is noted. Normal osseous structures. IMPRESSION: Suspected free air under the right and possible left hemidiaphragm. Scattered air-fluid levels within the colon, can be seen with diarrheal illness. No evidence of bowel obstruction. Critical Value/emergent results were called by telephone at the time of interpretation on 09/17/2016 at 11:49 pm to [employer] CNM, who verbally acknowledged these results. Electronically Signed   By: Rubye Oaks M.D.   On: 09/17/2016 23:45   Mr Abdomen Wo Contrast  Result Date: 09/18/2016 CLINICAL DATA:  Sudden onset abdominal pain. Free air seen on radiograph. EXAM: MRI ABDOMEN WITHOUT CONTRAST TECHNIQUE: Multiplanar multisequence MR imaging was performed without the administration of intravenous contrast. COMPARISON:  Abdominal radiograph 09/17/2016 FINDINGS: Lower chest: No abnormality identified. Hepatobiliary: No mass or other parenchymal abnormality identified. No biliary dilatation. Pancreas: Pancreatic contours are normal. No peripancreatic fluid collection. No pancreatic ductal dilatation. Spleen:  Normal. Adrenals/Urinary Tract: The right adrenal gland is normal. The left adrenal gland is not clearly seen. There is moderate right hydronephrosis. The left kidney is normal. Stomach/Bowel: There is no dilated bowel. No focal bowel wall thickening. There is trace fluid within the left pericolic gutter but no well-defined fluid collection. MRI is not sensitive for the detection of small volume free air. Vascular/Lymphatic: The course and caliber of the major vessels of the abdomen are normal. Expected flow  voids are maintained. No abdominal lymphadenopathy. Other: There is a singleton intrauterine pregnancy. This study is not optimized for evaluation of the fetus. Musculoskeletal: No focal marrow lesion. IMPRESSION: 1. No dilated small bowel or fluid collection within the abdomen. MRI is insensitive for the detection of small volume free air.  Given the free air seen on the comparison radiograph, CT of the abdomen and pelvis may be warranted to evaluate for possible bowel perforation. At 4326 weeks gestational age, the fetus is beyond the most radiation sensitive period. 2. Moderate right hydronephrosis of pregnancy. The findings were discussed with Dr. Juliene PinaMody by Dr. Manus GunningEhinger at 4:28 AM on 09/18/2016. Electronically Signed   By: Deatra RobinsonKevin  Herman M.D.   On: 09/18/2016 05:05   Ct Abdomen Pelvis W Contrast  Result Date: 09/18/2016 CLINICAL DATA:  30 year old female with sudden onset of diffuse abdominal pain at 1 a.m. today. Acute onset of pain approximately 15 minutes after completion of an enema. Abdominal tenderness on examination. No associated nausea or vomiting. EXAM: CT ABDOMEN AND PELVIS WITH CONTRAST TECHNIQUE: Multidetector CT imaging of the abdomen and pelvis was performed using the standard protocol following bolus administration of intravenous contrast. CONTRAST:  80mL ISOVUE-300 IOPAMIDOL (ISOVUE-300) INJECTION 61% COMPARISON:  CT the abdomen and pelvis 03/27/2016. FINDINGS: Lower chest: Unremarkable. Hepatobiliary: No cystic or solid hepatic lesions. No intra or extrahepatic biliary ductal dilatation. Gallbladder is normal in appearance. Pancreas: No pancreatic mass. No pancreatic ductal dilatation. No pancreatic or peripancreatic fluid or inflammatory changes. Spleen: Unremarkable. Adrenals/Urinary Tract: Mild bilateral hydroureteronephrosis, presumably related to extrinsic compression on the ureters from the gravid uterus. Bilateral kidneys and bilateral adrenal glands are otherwise unremarkable in appearance. Urinary bladder is unremarkable in appearance. Stomach/Bowel: The appearance of the stomach is normal. There is no pathologic dilatation of small bowel or colon. Oral contrast material has traversed the small bowel, with a small amount of contrast material extending into the cecum. No definite extravasation of oral contrast material is  noted on today's examination. Status post appendectomy. Vascular/Lymphatic: No significant atherosclerotic disease, aneurysm or dissection identified in the abdominal or pelvic vasculature. No lymphadenopathy noted in the abdomen or pelvis. Reproductive: Gravid uterus and single IUP noted. Placenta is predominantly fundal and posterior in location. Ovaries are unremarkable in appearance. Other: Small volume of pneumoperitoneum noted throughout the upper abdomen adjacent to the spleen, posterior to the right lobe of the liver, adjacent to the falciform ligament, and beneath both of the hemidiaphragms. Trace ascites. No significant volume of ascites. Musculoskeletal: There are no aggressive appearing lytic or blastic lesions noted in the visualized portions of the skeleton. IMPRESSION: 1. Small volume of pneumoperitoneum, as above. No definite source confidently identified on today's examination. 2. Gravid uterus with single IUP which is causing some mild compression of the distal ureters bilaterally resulting in some mild bilateral hydroureteronephrosis. 3. Status post appendectomy. Critical Value/emergent results were called by telephone at the time of interpretation on 09/18/2016 at 9:22 am to Dr. Alona BeneJoshua Long, who verbally acknowledged these results. Electronically Signed   By: Trudie Reedaniel  Entrikin M.D.   On: 09/18/2016 09:23   Dg Abd Decub  Result Date: 09/18/2016 CLINICAL DATA:  30 year old pregnant female with acute onset of abdominal pain. Possible pneumoperitoneum noted on prior abdominal radiograph. EXAM: ABDOMEN - 1 VIEW DECUBITUS COMPARISON:  Abdominal radiograph 09/09/2016. FINDINGS: Lower abdomen and pelvis is shielded by a lead apron, preventing visualization. Within the upper abdomen there is a small volume of nondependent free air noted adjacent to the liver. Oral  contrast material is noted within the small bowel. No pathologic dilatation of bowel is noted. Some air-fluid levels are noted within small  bowel loops. IMPRESSION: 1. Study is positive for small volume of pneumoperitoneum. Critical Value/emergent results were called by telephone at the time of interpretation on 09/18/2016 at 8:34 am to Dr. Alona BeneJOSHUA LONG, who verbally acknowledged these results. Electronically Signed   By: Trudie Reedaniel  Entrikin M.D.   On: 09/18/2016 08:34   Prior to Admission medications   Medication Sig Start Date End Date Taking? Authorizing Provider  acetaminophen (TYLENOL) 500 MG tablet Take 1,000 mg by mouth every 6 (six) hours as needed for mild pain.   Yes Historical Provider, MD  bisacodyl (DULCOLAX) 10 MG suppository Place 10 mg rectally once.   Yes Historical Provider, MD  bisacodyl (DULCOLAX) 5 MG EC tablet Take 5 mg by mouth once.   Yes Historical Provider, MD  polyethylene glycol (MIRALAX / GLYCOLAX) packet Take 17 g by mouth once.   Yes Historical Provider, MD  Prenatal Vit-Fe Fumarate-FA (PRENATAL MULTIVITAMIN) TABS tablet Take 1 tablet by mouth daily at 12 noon.   Yes Historical Provider, MD  ondansetron (ZOFRAN ODT) 8 MG disintegrating tablet Take 1 tablet (8 mg total) by mouth every 8 (eight) hours as needed for nausea or vomiting. Patient not taking: Reported on 09/17/2016 09/11/16   Armando ReichertHeather D Hogan, CNM  promethazine (PHENERGAN) 25 MG tablet Take 0.5-1 tablets (12.5-25 mg total) by mouth every 6 (six) hours as needed. Patient not taking: Reported on 09/17/2016 09/11/16   Armando ReichertHeather D Hogan, CNM    Medications: . pantoprazole (PROTONIX) IV  40 mg Intravenous Q12H  . piperacillin-tazobactam (ZOSYN)  IV  3.375 g Intravenous Q8H  . prenatal multivitamin  1 tablet Oral Q1200   . lactated ringers Stopped (09/19/16 0501)   Assessment/Plan Perforation of sigmoid colon DIAGNOSTIC LAPAROSCOPY, LAPAROSCOPIC SIGMOID COLON RESECTION, COLOSTOMY  (Hartmann procedure), 09/08/16, Dr. Johna SheriffHoxworth 26 weeks and 6 days gestation Fen:  Npo/IV fluids ID:  Zosyn day 2 DVT:  No anticoagulant/SCD   Plan:  D/c foley in AM, OOB  to chair and some walking today.  She wants some tylenol for headache and she is on her pre natal vitamin.  Dr. Seymour BarsLavoie is in to see her now.     LOS: 1 day    Adelheid Hoggard 09/19/2016 [phone]

## 2016-09-19 NOTE — Progress Notes (Signed)
1 Day Post-Op Procedure(s) (LRB): DIAGNOSTIC LAPAROSCOPY (N/A) LAPAROSCOPIC SIGMOID COLON RESECTION (N/A) COLOSTOMY (N/A) G2P0A1 at 26+ wks  Subjective: Patient reports that pain is well managed.  No UCs currently.  Felt 1 episode of increased vaginal d/c, fluid this am.  No vaginal bleeding.  Mild FMs.  Objective: BP (!) 103/45   Pulse 77   Temp 98.2 F (36.8 C) (Oral)   Resp 17   Ht 5\' 7"  (1.702 m)   Wt 143 lb (64.9 kg)   LMP 03/15/2016   SpO2 91%   BMI 22.40 kg/m   Abdomen: Gravid, soft and appropriately tender.  Small stools in Colostomy bag. VE closed/long/firm/post cervix.  Presentation cephalic, floating.  Normal vaginal secretions.  No AF. Incision: healing well    Assessment: G2P0A1 at 26+ wks with no evidence of PTL or PPROM.  Cervix closed/long/firm.  Vaginal secretions wnl.  s/p Procedure(s): DIAGNOSTIC LAPAROSCOPY LAPAROSCOPIC SIGMOID COLON RESECTION COLOSTOMY: progressing well  Plan: Diet progression per General Surgery.   Will confirm Fetal well-being with NST today and US EFT/AFI before d/c.  Restart PNVs.  LOS: 1 day    Lauren Zhang 09/19/2016, 10:37 AM

## 2016-09-19 NOTE — Progress Notes (Signed)
Spoke with Dr. Seymour BarsLavoie. Pt is contracting every 2-6 min. Pt feels surgical pain that she is rating a 3-4. She says she doesn't know what contractions feel like. Dr. Seymour BarsLavoie says that she checked the pt's cervix this morning and it was closed. FHR tracing is a category 1. She wants pt to take po fluids as tolerated.

## 2016-09-20 ENCOUNTER — Encounter (HOSPITAL_COMMUNITY): Payer: Self-pay | Admitting: General Surgery

## 2016-09-20 MED ORDER — HYDROMORPHONE HCL 2 MG/ML IJ SOLN
1.0000 mg | INTRAMUSCULAR | Status: DC | PRN
  Administered 2016-09-20: 1 mg via INTRAVENOUS
  Filled 2016-09-20: qty 1

## 2016-09-20 MED ORDER — PANTOPRAZOLE SODIUM 40 MG PO TBEC
40.0000 mg | DELAYED_RELEASE_TABLET | Freq: Two times a day (BID) | ORAL | Status: DC
  Administered 2016-09-20 – 2016-09-22 (×4): 40 mg via ORAL
  Filled 2016-09-20 (×4): qty 1

## 2016-09-20 MED ORDER — HYDROCODONE-ACETAMINOPHEN 10-325 MG PO TABS
1.0000 | ORAL_TABLET | ORAL | Status: DC | PRN
  Administered 2016-09-20 – 2016-09-21 (×6): 1 via ORAL
  Filled 2016-09-20 (×6): qty 1

## 2016-09-20 NOTE — Consult Note (Signed)
WOC Nurse ostomy follow up Stoma type/location: LLQ colostomy, created emergently on Friday, 09/18/16 by Dr. Johna SheriffHoxworth. Stomal assessment/size: Slightly oval 1 and 1/2 inch ostomy, red, budded, edematous, os at center. Peristomal assessment: Intact, clear Treatment options for stomal/peristomal skin: None indicated Output: small amount of serosanguinous effluent today.  Ostomy pouching: 2pc. pouching system (2 and 1/4 inches).  Pouch with integrated gas filter provided today and another set up (with same pouch) left at bedside. Education provided:Husband is out of room briefly, and will be returning later tonight.  We decide to proceed without him this first teaching session and acknowledge that he will try to be present for all other sessions. Two teaching booklets left at bedside, also a 1-page teaching tip sheet on 2-piece pouch change procedure. 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 Patient is instructed on GI A&P, specifically that there is no sensation to the stoma and that stoma characteristics are similar to those of her her oral cavity: as a mucus membrane, the stoma may bleed slightly with cleansing, is edematous and will reduce in size as traumatic response subsides. Patient asking appropriate questions.  Is tearful and appropriately responsive to support and empathy. Understands that ostomy will be temporary and that she will be able to schedule the elective reversal when she is ready to separate from her newborn for a few days. Asking if ostomy prevents her form vaginal delivery and is taught that while she will need to discuss further with her OB, that many women with ostomies deliver vaginally. Discussed that she can shower and bather with an ostomy, with and without her pouch. Discussed pouching options: specifically, drainable vs closed end pouches-both with integrated gas  filters. Patient would definitely like to consider closed end pouches as a pouching option. Patient provided with WOC nurse contact information in the event she has questions prior to next visit. Next visit will be Tuesday, 09/22/16. Enrolled patient in [place] Secure [place] Discharge program: No. Note: Signature Card obtained today and will be mailed by this Clinical research associatewriter. Thank you for inviting us to consult with you on this lovely woman for continuing education and support during the time she has her ostomy. WOC nursing team will follow, will remain available to this patient, the nursing, surgical and medical teams.   Ladona MowLaurie Honestee Revard, MSN, RN, GNP, Hans EdenCWOCN, CWON-AP, FAAN  Pager# [phone]) [phone]

## 2016-09-20 NOTE — Progress Notes (Signed)
  Progress Note: General Surgery Service   Subjective: Pain much improved, nausea resolved, tolerated some liquids this am.  Objective: Vital signs in last 24 hours: Temp:  [98.1 F (36.7 C)-98.7 F (37.1 C)] 98.3 F (36.8 C) (12/31 0451) Pulse Rate:  [75-89] 75 (12/31 0600) Resp:  [9-22] 9 (12/31 0600) BP: (106-115)/(67-75) 106/67 (12/31 0454) SpO2:  [93 %-98 %] 93 % (12/31 0600) Last BM Date: 09/19/16  Intake/Output from previous day: 12/30 0701 - 12/31 0700 In: 2490 [P.O.:840; I.V.:1500; IV Piggyback:150] Out: 3250 [Urine:3250] Intake/Output this shift: Total I/O In: 75 [I.V.:75] Out: 800 [Urine:800]  Lungs: CTAB  Cardiovascular: RRR  Abd: soft, ATTP, incisions c/d/i, ostomy pink, small amount air in bag with serosang drainage  Extremities: no edema  Neuro: AOx4  Lab Results: CBC   Recent Labs  09/17/16 2306 09/19/16 0307  WBC 14.4* 11.9*  HGB 12.0 9.7*  HCT 33.9* 28.7*  PLT 270 230   BMET  Recent Labs  09/17/16 2306 09/19/16 0307  NA 136 135  K 3.6 3.4*  CL 106 102  CO2 24 23  GLUCOSE 115* 80  BUN <5* 5*  CREATININE 0.56 0.70  CALCIUM 8.5* 8.1*   PT/INR No results for input(s): LABPROT, INR in the last 72 hours. ABG No results for input(s): PHART, HCO3 in the last 72 hours.  Invalid input(s): PCO2, PO2  Studies/Results:  Anti-infectives: Anti-infectives    Start     Dose/Rate Route Frequency Ordered Stop   09/18/16 1400  piperacillin-tazobactam (ZOSYN) IVPB 3.375 g     3.375 g 12.5 mL/hr over 240 Minutes Intravenous Every 8 hours 09/18/16 1104     09/18/16 1300  piperacillin-tazobactam (ZOSYN) IVPB 3.375 g     3.375 g 100 mL/hr over 30 Minutes Intravenous STAT 09/18/16 1250 09/18/16 1300      Medications: Scheduled Meds: . pantoprazole (PROTONIX) IV  40 mg Intravenous Q12H  . piperacillin-tazobactam (ZOSYN)  IV  3.375 g Intravenous Q8H  . prenatal multivitamin  1 tablet Oral Q1200   Continuous Infusions: . dextrose 5%  lactated ringers with KCl 20 mEq/L 75 mL/hr at 09/19/16 2336   PRN Meds:.acetaminophen, HYDROcodone-acetaminophen, HYDROmorphone, ondansetron (ZOFRAN) IV, promethazine  Assessment/Plan: Patient Active Problem List   Diagnosis Date Noted  . Intra-abdominal free air of unknown etiology 09/18/2016   s/p Procedure(s): DIAGNOSTIC LAPAROSCOPY LAPAROSCOPIC SIGMOID COLON RESECTION COLOSTOMY 09/18/2016 -advance diet -add oral pain meds -ambulate    LOS: 2 days   Rodman PickleLuke Aaron Kinsinger, MD Pg# [phone] [facility]

## 2016-09-20 NOTE — Progress Notes (Signed)
2 Days Post-Op Procedure(s) (LRB): DIAGNOSTIC LAPAROSCOPY (N/A) LAPAROSCOPIC SIGMOID COLON RESECTION (N/A) COLOSTOMY (N/A)   G2P0A1 at 26+ wks  Subjective: Patient reports that pain is well managed.  Tolerating   Objective: BP 112/75 (BP Location: Right Arm)   Pulse 78   Temp 98.3 F (36.8 C) (Oral)   Resp (!) 9   Ht 5\' 7"  (1.702 m)   Wt 143 lb (64.9 kg)   LMP 03/15/2016   SpO2 93%   BMI 22.40 kg/m    Abdomen: Gravid, soft and appropriately tender Extremities: wnl Incision: healing well  NST Base line 145/min, good variability, no deceleration. No UCs, some minor irritability.  Assessment: 26+ wks with Reassuring FHR monitoring this morning.  No sign of PTL, PPROM. Will do OB US EFW/AFI as an outpatient at [facility] at next visit given no expertise for [facility]FI [facility].  s/p Procedure(s): DIAGNOSTIC LAPAROSCOPY LAPAROSCOPIC SIGMOID COLON RESECTION COLOSTOMY: progressing well  Plan: Continue daily NSTs until D/C home.  F/U [facility] with Dr Billy Coastaavon and Ob US next week.  LOS: 2 days    Lauren Zhang 09/20/2016, 1:05 PM

## 2016-09-20 NOTE — Plan of Care (Signed)
Problem: Pain Management: Goal: Pain level will decrease Outcome: Progressing Discussed plan of care with pain regiment and foley to be removed in the morning with teach back displayed.

## 2016-09-20 NOTE — Progress Notes (Signed)
Spoke with Dr. Seymour BarsLavoie. FHR 145 BPM, mod variability, no decels. Occasional ui. No vaginal bleeding or leaking of fluid. [facility] [place] [facility].

## 2016-09-21 DIAGNOSIS — Z9049 Acquired absence of other specified parts of digestive tract: Secondary | ICD-10-CM

## 2016-09-21 HISTORY — DX: Acquired absence of other specified parts of digestive tract: Z90.49

## 2016-09-21 MED ORDER — POTASSIUM CHLORIDE CRYS ER 10 MEQ PO TBCR: 10.0000 meq | EXTENDED_RELEASE_TABLET | Freq: Three times a day (TID) | ORAL | Status: DC

## 2016-09-21 NOTE — Progress Notes (Signed)
Here to perform daily NST for this 31 yo G2P0 @ 27.1 wks admitted with perforated segment of sigmoid colon with surgical intervention.  Patient denies vaginal bleeding or leaking of fluids and reports good fetal movement.  Dr. Ernestina PennaFogleman at bedside just following NST.  She reviewed the EFM and evaluated pt, answered her questions and concerns, and made a plan for next OB visits.

## 2016-09-21 NOTE — Progress Notes (Signed)
3 Days Post-Op  Subjective: She is up walking and looks quite good.  Nothing but sweat in her ostomy so far.  She want to shower.  Objective: Vital signs in last 24 hours: Temp:  [97.9 F (36.6 C)-98.2 F (36.8 C)] 97.9 F (36.6 C) (01/01 0335) Pulse Rate:  [75-87] 75 (01/01 0335) Resp:  [16-19] 18 (01/01 0335) BP: (97-111)/(65-68) 105/68 (01/01 0335) SpO2:  [96 %-99 %] 99 % (01/01 0335) Last BM Date: 09/19/16 480 PO 900IV 800 urine Sweat only from the ostomy Afebrile, VSS Labs yesterday shows K+ 3.4 WBC down to 11.9 Intake/Output from previous day: 12/31 0701 - 01/01 0700 In: 1412.3 [P.O.:480; I.V.:782.3; IV Piggyback:150] Out: 800 [Urine:800] Intake/Output this shift: No intake/output data recorded.  General appearance: alert, cooperative and no distress Resp: clear to auscultation bilaterally GI: soft, tender, she had some rectal pain yesterday.  sites all look good.  sweat fromt he ostomy  Lab Results:   Recent Labs  09/19/16 0307  WBC 11.9*  HGB 9.7*  HCT 28.7*  PLT 230    BMET  Recent Labs  09/19/16 0307  NA 135  K 3.4*  CL 102  CO2 23  GLUCOSE 80  BUN 5*  CREATININE 0.70  CALCIUM 8.1*   PT/INR No results for input(s): LABPROT, INR in the last 72 hours.   Recent Labs Lab 09/17/16 2306  AST 36  ALT 74*  ALKPHOS 50  BILITOT 0.4  PROT 6.5  ALBUMIN 3.6     Lipase  No results found for: LIPASE   Studies/Results: No results found.  Medications: . pantoprazole  40 mg Oral BID  . piperacillin-tazobactam (ZOSYN)  IV  3.375 g Intravenous Q8H  . prenatal multivitamin  1 tablet Oral Q1200   . dextrose 5% lactated ringers with KCl 20 mEq/L 30 mL/hr at 09/20/16 0903    Assessment/Plan Perforation of sigmoid colon DIAGNOSTIC LAPAROSCOPY, LAPAROSCOPIC SIGMOID COLON RESECTION, COLOSTOMY (Hartmann procedure), 09/08/16, Dr. Johna SheriffHoxworth [redacted] weeks gestation Fen:  Soft diet ID:  Zosyn day 4 KCL in IV DVT:  No anticoagulant/SCD/walking with  husband   PlaN:  Await bowel function, colostomy teaching and home soon.    LOS: 3 days    Kyngston Pickelsimer 09/21/2016 [phone]

## 2016-09-21 NOTE — Progress Notes (Signed)
3 Days Post-Op Procedure(s) (LRB): DIAGNOSTIC LAPAROSCOPY (N/A) LAPAROSCOPIC SIGMOID COLON RESECTION (N/A) COLOSTOMY (N/A)   G2P0A1 at 27'1 wks  Subjective: Patient reports that pain is well managed. IV Dilaudid last night, oral narcotic during day.  Tolerated more normal diet last night, small breakfast this am. No nausea. Ambulating. No vaginal bleeding, no contractions.  Objective: BP 105/68 (BP Location: Left Arm)   Pulse 75   Temp 97.9 F (36.6 C) (Oral)   Resp 18   Ht 5\' 7"  (1.702 m)   Wt 64.9 kg (143 lb)   LMP 03/15/2016   SpO2 99%   BMI 22.40 kg/m    Abdomen: Gravid, soft and appropriately tender, fundus just above umbilicus and non-tender.  Extremities: wnl Incision: healing well  NST Base line 135s.  good variability, no deceleration. 10 x 10 accels present Single contraction, not felt by pt, no significant irritability.  Assessment: 27'1  wks with Reassuring FHR monitoring this morning.  No sign of PTL, PPROM. Pt has not yet had BMZ or Magnesium.  Will do OB US [facility]FW/AFI [facility] an [facility] at next visit. Will move 1 hr GTT back 1 wk to avoid false positive from recent stress, also to allow better GI function and avoid possible N/V with GTT. Assess for anemia with 1hr GTT. Will plan iron if anemic only after off narcotics and return of bowel function.   s/p Procedure(s): DIAGNOSTIC LAPAROSCOPY LAPAROSCOPIC SIGMOID COLON RESECTION COLOSTOMY: progressing well  Plan: Continue daily NSTs until D/C home.  F/U [facility] with Dr Billy Coastaavon and Ob US next week.  LOS: 3 days    Brnadon Eoff A. 09/21/2016, 12:34 PM

## 2016-09-21 NOTE — L&D Delivery Note (Signed)
Delivery Note At 11:55 AM a viable and healthy female was delivered via Vaginal, Spontaneous Delivery (Presentation: LOA ).  APGAR: 7, 8; weight 7 lb 9 oz (3430 g).   Placenta status: spontaneous, intact.  Cord:  with the following complications: none.  Cord pH: na  Right vaginal wall hematoma evacuated approx 20cc and deep 0 vicryl sutures placed x 2. Good hemostasis noted and no further re-expansion of hematoma noted.   Anesthesia:  epidural Episiotomy: None Lacerations:  second Suture Repair: 2.0 3.0 vicryl rapide Est. Blood Loss (mL):  100  Mom to postpartum.  Baby to Couplet care / Skin to Skin.  Thersa Mohiuddin J 12/21/2016, 12:19 PM

## 2016-09-22 ENCOUNTER — Encounter (HOSPITAL_COMMUNITY): Payer: Self-pay | Admitting: *Deleted

## 2016-09-22 LAB — CBC
HCT: 27.7 % — ABNORMAL LOW (ref 36.0–46.0)
HEMOGLOBIN: 9.5 g/dL — AB (ref 12.0–15.0)
MCH: 30.4 pg (ref 26.0–34.0)
MCHC: 34.3 g/dL (ref 30.0–36.0)
MCV: 88.5 fL (ref 78.0–100.0)
Platelets: 221 10*3/uL (ref 150–400)
RBC: 3.13 MIL/uL — AB (ref 3.87–5.11)
RDW: 12.7 % (ref 11.5–15.5)
WBC: 6.1 10*3/uL (ref 4.0–10.5)

## 2016-09-22 LAB — BASIC METABOLIC PANEL
ANION GAP: 5 (ref 5–15)
BUN: 6 mg/dL (ref 6–20)
CHLORIDE: 106 mmol/L (ref 101–111)
CO2: 24 mmol/L (ref 22–32)
Calcium: 8.1 mg/dL — ABNORMAL LOW (ref 8.9–10.3)
Creatinine, Ser: 0.72 mg/dL (ref 0.44–1.00)
GFR calc Af Amer: >60 mL/min (ref >60)
GLUCOSE: 94 mg/dL (ref 65–99)
POTASSIUM: 3.3 mmol/L — AB (ref 3.5–5.1)
Sodium: 135 mmol/L (ref 135–145)

## 2016-09-22 MED ORDER — HYDROCODONE-ACETAMINOPHEN 10-325 MG PO TABS: 1.0000 | ORAL_TABLET | ORAL | 0 refills | Status: DC | PRN

## 2016-09-22 MED ORDER — POTASSIUM CHLORIDE CRYS ER 20 MEQ PO TBCR
40.0000 meq | EXTENDED_RELEASE_TABLET | Freq: Once | ORAL | Status: AC
  Administered 2016-09-22: 40 meq via ORAL
  Filled 2016-09-22: qty 2

## 2016-09-22 NOTE — Care Management Note (Signed)
Case Management Note  Patient Details  Name: Lauren Zhang MRN: [id number] Date of Birth: 04/04/1986  Subjective/Objective:  S/p lap sigmoid olon resection, colostomy( hartman procedure), [redacted] weeks gestation, she is for dc today, [facility] , patient states she feels she and her husband will be ok and she does not want HHRN.  NCM also spoke with Dawn the woc  RN, she will be ordering supplies from hospital for  for patient to take home for ostomy and she also will be ordering supplies from Supplier.  Patient has a bsc and a rolling walker in her room already.  NCM will cont to follow for dc needs.                  Action/Plan:   Expected Discharge Date:  09/23/16               Expected Discharge Plan:  Home w Home Health Services  In-House Referral:     Discharge planning Services  CM Consult  Post Acute Care Choice:    Choice offered to:  Patient  DME Arranged:    DME Agency:     HH Arranged:  RN, Patient Refused HH Agency:     Status of Service:  Completed, signed off  If discussed at [employer] Length of Stay Meetings, dates discussed:    Additional Comments:  Leone Havenaylor, Emberleigh Reily Clinton, RN 09/22/2016, 10:59 AM

## 2016-09-22 NOTE — Consult Note (Addendum)
WOC Nurse ostomy follow up Stoma type/location: LLQ colostomy, created emergently on Friday, 09/18/16 by Dr. Johna SheriffHoxworth. Stomal assessment/size:  1 and 1/2 inch ostomy, raised above skin level, slightly edematous, os at center. Peristomal assessment: Intact Output: Small amount of brown liquid stool in the pouch.  Ostomy pouching: 2pc. pouching system (2 and 1/4 inches).   Education provided: Pt performed pouch change independently with husband at the bedside.  She states she has practiced emptying in the toilet.  Demonstrated 2 different closed-end pouches and free samples provided for patient use.  Reviewed pouching routines and ordering supplies.  Pt asks appropriate questions and plans to discharge home today. Enrolled [place] in [facility] Dis[facility] program: Yes Cammie Mcgeeawn Leani Myron MSN, RN, [place], [place], [place] [phone]

## 2016-09-22 NOTE — Discharge Instructions (Signed)
please arrive at least 30 min before your appointment to complete your check in paperwork.  If you are unable to arrive 30 min prior to your appointment time we may have to cancel or reschedule you.  LAPAROSCOPIC SURGERY: POST OP INSTRUCTIONS  1. DIET: Follow a light bland diet the first 24 hours after arrival home, such as soup, liquids, crackers, etc. Be sure to include lots of fluids daily. Avoid fast food or heavy meals as your are more likely to get nauseated. Eat a low fat the next few days after surgery.  2. Take your usually prescribed home medications unless otherwise directed. 3. PAIN CONTROL:  1. Pain is best controlled by a usual combination of three different methods TOGETHER:  1. Ice/Heat 2. Over the counter pain medication 3. Prescription pain medication 2. Most patients will experience some swelling and bruising around the incisions. Ice packs or heating pads (30-60 minutes up to 6 times a day) will help. Use ice for the first few days to help decrease swelling and bruising, then switch to heat to help relax tight/sore spots and speed recovery. Some people prefer to use ice alone, heat alone, alternating between ice & heat. Experiment to what works for you. Swelling and bruising can take several Baray to resolve.  3. It is helpful to take an over-the-counter pain medication regularly for the first few Be[place]ley. Choose one of the following that works best for you:  1. Naproxen (Aleve, etc) Two 220mg  tabs twice a day 2. Ibuprofen (Advil, etc) Three 200mg  tabs four times a day (every meal & bedtime) 3. Acetaminophen (Tylenol, etc) 500-650mg  four times a day (every meal & bedtime) 4. A prescription for pain medication (such as oxycodone, hydrocodone, etc) should be given to you upon discharge. Take your pain medication as prescribed.  1. If you are having problems/concerns with the prescription medicine (does not control pain, nausea, vomiting, rash, itching, etc), please call us [phone]  [phone] to see if we need to switch you to a different pain medicine that will work better for you and/or control your side effect better. 2. If you need a refill on your pain medication, please contact your pharmacy. They will contact our office to request authorization. Prescriptions will not be filled after 5 pm or on week-ends. 4. Avoid getting constipated. Between the surgery and the pain medications, it is common to experience some constipation. Increasing fluid intake and taking a fiber supplement (such as Metamucil, Citrucel, FiberCon, MiraLax, etc) 1-2 times a day regularly will usually help prevent this problem from occurring. A mild laxative (prune juice, Milk of Magnesia, MiraLax, etc) should be taken according to package directions if there are no bowel movements after 48 hours.  5. Watch out for diarrhea. If you have many loose bowel movements, simplify your diet to bland foods & liquids for a few days. Stop any stool softeners and decrease your fiber supplement. Switching to mild anti-diarrheal medications (Kayopectate, Pepto Bismol) can help. If this worsens or does not improve, please call us. 6. Wash / shower every day. You may shower over the dressings as they are waterproof. Continue to shower over incision(s) after the dressing is off. 7. Remove your waterproof bandages 5 days after surgery. You may leave the incision open to air. You may replace a dressing/Band-Aid to cover the incision for comfort if you wish.  8. ACTIVITIES as tolerated:  1. You may resume regular (light) daily activities beginning the next day--such as daily self-care, walking, climbing stairs--gradually  increasing activities as tolerated. If you can walk 30 minutes without difficulty, it is safe to try more intense activity such as jogging, treadmill, bicycling, low-impact aerobics, swimming, etc. 2. Save the most intensive and strenuous activity for last such as sit-ups, heavy lifting, contact sports, etc Refrain  from any heavy lifting or straining until you are off narcotics for pain control.  3. DO NOT PUSH THROUGH PAIN. Let pain be your guide: If it hurts to do something, don't do it. Pain is your body warning you to avoid that activity for another week until the pain goes down. 4. You may drive when you are no longer taking prescription pain medication, you can comfortably wear a seatbelt, and you can safely maneuver your car and apply brakes. 5. You may have sexual intercourse when it is comfortable.  9. FOLLOW UP in our office  1. Please call [facility] at [phone] to set up an appointment to see your surgeon in the office for a follow-up appointment approximately 2-3 weeks after your surgery. 2. Make sure that you call for this appointment the day you arrive home to insure a convenient appointment time.      10. IF YOU HAVE DISABILITY OR FAMILY LEAVE FORMS, BRING THEM TO THE               OFFICE FOR PROCESSING.   WHEN TO CALL us [phone]:  1. Poor pain control 2. Reactions / problems with new medications (rash/itching, nausea, etc)  3. Fever over 101.5 F (38.5 C) 4. Inability to urinate 5. Nausea and/or vomiting 6. Worsening swelling or bruising 7. Continued bleeding from incision. 8. Increased pain, redness, or drainage from the incision  The clinic staff is available to answer your questions during regular business hours (8:30am-5pm). Please dont hesitate to call and ask to speak to one of our nurses for clinical concerns.  If you have a medical emergency, go to the nearest emergency room or call 911.  A surgeon from [facility] is always on call at the [facility], [place]  [address], [address], [place], [place] [address] ?  MAIN: [phone] ? TOLL FREE: [phone] ?  FAX [phone]  [email or web address]   Colostomy, Adult, Care After Refer to this sheet in the next few weeks. These instructions provide you with  information about caring for yourself after your procedure. Your health care provider may also give you more specific instructions. Your treatment has been planned according to current medical practices, but problems sometimes occur. Call your health care provider if you have any problems or questions after your procedure. What can I expect after the procedure? After the procedure, it is common to have:  Swelling at the opening that was created during the procedure (stoma).  Slight bleeding around the stoma.  Redness around the stoma. Follow these instructions at home: Activity  Rest as needed while the stoma area heals.  Return to your normal activities as told by your health care provider. Ask your health care provider what activities are safe for you.  Avoid strenuous activity and abdominal exercises for 3 weeks or for as long as told by your health care provider.  Do not lift anything that is heavier than 10 lb (4.5 kg). Incision care  Follow instructions from your health care provider about how to take care of your incision. Make sure you:  Wash your hands with soap and water before you change your bandage (dressing).  If soap and water are not available, use hand sanitizer.  Change your dressing as told by your health care provider.  Leave stitches (sutures), skin glue, or adhesive strips in place. These skin closures may need to stay in place for 2 weeks or longer. If adhesive strip edges start to loosen and curl up, you may trim the loose edges. Do not remove adhesive strips completely unless your health care provider tells you to do that. Stoma Care  Keep the stoma area clean.  Clean and dry the skin around the stoma each time you change the colostomy bag. To clean the stoma area:  Use warm water and only use cleansers that are recommended by your health care provider.  Rinse the stoma area with plain water.  Dry the area well.  Use stoma powder or ointment on your  skin only as told by your health care provider. Do not use any other powders, gels, wipes, or creams on your skin.  Check the stoma area every day for signs of infection. Check for:  More redness, swelling, or pain.  More fluid or blood.  Pus or warmth.  Measure the stoma opening regularly and record the size. Watch for changes. Share this information with your health care provider. Bathing  Do not take baths, swim, or use a hot tub until your health care provider approves. Ask your health care provider if you can take showers. You may be able to shower with or without the colostomy bag in place. If you bathe with the bag on, dry the bag afterward.  Avoid using harsh or oily soaps when you bathe. Colostomy Bag Care  Follow instructions from your health care provider about how to empty or change the colostomy bag.  Keep colostomy supplies with you at all times.  Store all supplies in a cool, dry place.  Empty the colostomy bag:  Whenever it is one-third to one-half full.  At bedtime.  Replace the bag every 2-4 days or as told by your health care provider. Driving  Do not drive for 24 hours if you received a sedative.  Do not drive or operate heavy machinery while taking prescription pain medicine. General instructions  Follow instructions from your health care provider about eating or drinking restrictions.  Take over-the-counter and prescription medicines only as told by your health care provider.  Avoid wearing clothes that are tight directly over your stoma.  Do not use any tobacco products, such as cigarettes, chewing tobacco, and e-cigarettes. If you need help quitting, ask your health care provider.  (Women) Ask your health care provider about becoming pregnant and about using birth control. Medicines may not be absorbed normally after the procedure.  Keep all follow-up visits as told by your health care provider. This is important. Contact a health care provider  if:  You are having trouble caring for your stoma or changing the colostomy bag.  You feel nauseous or you vomit.  You have a fever.  You havemore redness, swelling, or pain at the site of your stoma or around your anus.  You have more fluid or blood coming from your stoma or your anus.  Your stoma area feels warm to the touch.  You have pus coming from your stoma.  You notice a change in the size or appearance of the stoma.  You have abdominal pain, bloating, pressure, or cramping.  Your have stool more often or less often than your health care provider tells you to expect.  You  are not making much urine. This may be a sign of dehydration. Get help right away if:  Your abdominal pain does not go away or it becomes severe.  You keep vomiting.  Your stool is not draining through the stoma.  You have chest pain or an irregular heartbeat. This information is not intended to replace advice given to you by your health care provider. Make sure you discuss any questions you have with your health care provider. Document Released: 01/28/2011 Document Revised: 01/16/2016 Document Reviewed: 05/21/2015 Elsevier Interactive Patient Education  2017 [employer].

## 2016-09-22 NOTE — Consult Note (Signed)
WOC Nurse ostomy follow up Attempted to call husband and arrange ostomy pouch change and teaching session this afternoon, since patient plans to discharge home today, according to the EMR.  Left a message with that phone number and awaiting a call back from him to arrange the time of the session.  5 sets of supplies ordered to the room for use after discharge. Enrolled patient in [place] [facility] Discharge program: Yes Cammie Mcgeeawn Naryiah Schley MSN, RN, [place], [place], [place] [phone]

## 2016-09-22 NOTE — Progress Notes (Signed)
Lauren SaleEmily Zhang to be D/C'd  per MD order. Discussed with the patient and all questions fully answered.  VSS, Skin clean, dry and intact without evidence of skin break down, no evidence of skin tears noted.  IV catheter discontinued intact. Site without signs and symptoms of complications. Dressing and pressure applied.  An After Visit Summary was printed and given to the patient. Patient received prescription.  D/c education completed with patient/family including follow up instructions, medication list, d/c activities limitations if indicated, with other d/c instructions as indicated by MD - patient able to verbalize understanding, all questions fully answered.   Patient instructed to return to ED, call 911, or call MD for any changes in condition.   Patient to be escorted via WC, and D/C home via private auto. Lauren SaleEmily Zhang to be D/C'd  per MD order. Discussed with the patient and all questions fully answered.  VSS, Skin clean, dry and intact without evidence of skin break down, no evidence of skin tears noted.  IV catheter discontinued intact. Site without signs and symptoms of complications. Dressing and pressure applied.  An After Visit Summary was printed and given to the patient. Patient received prescription.  D/c education completed with patient/family including follow up instructions, medication list, d/c activities limitations if indicated, with other d/c instructions as indicated by MD - patient able to verbalize understanding, all questions fully answered.   Patient instructed to return to ED, call 911, or call MD for any changes in condition.   Patient declined WC transport, and D/C home via private auto.

## 2016-09-22 NOTE — Progress Notes (Signed)
[facility] Progress Note  4 Days Post-Op  Subjective: Return of bowel function this morning - soft stool in colostomy pouch. No acute events - pain controlled, tolerating PO, ambulating.  Objective: Vital signs in last 24 hours: Temp:  [97.6 F (36.4 C)-98 F (36.7 C)] 98 F (36.7 C) (01/02 0600) Pulse Rate:  [74-76] 74 (01/01 2123) Resp:  [18] 18 (01/02 0600) BP: (98-111)/(57-72) 98/57 (01/02 0600) SpO2:  [97 %-99 %] 99 % (01/02 0600) Last BM Date: 09/19/16  Intake/Output from previous day: 01/01 0701 - 01/02 0700 In: 1692.5 [P.O.:1060; I.V.:482.5; IV Piggyback:150] Out: 0  Intake/Output this shift: No intake/output data recorded.  PE: Gen:  Alert, NAD, pleasant Card:  RRR, no M/G/R appreciated Pulm:  CTA, unlabored Abd: Soft, uterus palpable above umbilicus consistent with 27 week pregnancy, abbdomen appropriately tender, +BS, colostomy pouch in place with good seal - large amount soft stool in pouch. Ext:  No erythema or tenderness   Lab Results:   Recent Labs  09/22/16 0444  WBC 6.1  HGB 9.5*  HCT 27.7*  PLT 221   BMET  Recent Labs  09/22/16 0444  NA 135  K 3.3*  CL 106  CO2 24  GLUCOSE 94  BUN 6  CREATININE 0.72  CALCIUM 8.1*   PT/INR No results for input(s): LABPROT, INR in the last 72 hours. CMP     Component Value Date/Time   NA 135 09/22/2016 0444   K 3.3 (L) 09/22/2016 0444   CL 106 09/22/2016 0444   CO2 24 09/22/2016 0444   GLUCOSE 94 09/22/2016 0444   BUN 6 09/22/2016 0444   CREATININE 0.72 09/22/2016 0444   CALCIUM 8.1 (L) 09/22/2016 0444   PROT 6.5 09/17/2016 2306   ALBUMIN 3.6 09/17/2016 2306   AST 36 09/17/2016 2306   ALT 74 (H) 09/17/2016 2306   ALKPHOS 50 09/17/2016 2306   BILITOT 0.4 09/17/2016 2306   GFRNONAA >60 09/22/2016 0444   GFRAA >60 09/22/2016 0444   Lipase  No results found for: LIPASE  Studies/Results: No results found.  Anti-infectives: Anti-infectives    Start     Dose/Rate Route  Frequency Ordered Stop   09/18/16 1400  piperacillin-tazobactam (ZOSYN) IVPB 3.375 g     3.375 g 12.5 mL/hr over 240 Minutes Intravenous Every 8 hours 09/18/16 1104     09/18/16 1300  piperacillin-tazobactam (ZOSYN) IVPB 3.375 g     3.375 g 100 mL/hr over 30 Minutes Intravenous STAT 09/18/16 1250 09/18/16 1300       Assessment/Plan Perforation of sigmoid colon DIAGNOSTIC LAPAROSCOPY,LAPAROSCOPIC SIGMOID COLON RESECTION,COLOSTOMY (Hartmann procedure), 09/08/16, Dr. Johna SheriffHoxworth  Hypokalemia - 3.3; 40 mEq PO x1 today [redacted] weeks gestation pregnancy - NST daily until discharge, follow up with Dr. Billy Coastaavon   Fen: Soft diet ID: Zosyn day 4 KCL in IV DVT: No anticoagulant/SCD/walking with husband     Plan: discharge home today after seen by WOC again    LOS: 4 days    Lauren PhenixElizabeth S Sixto Bowdish , [facility] 09/22/2016, 8:08 AM Pager: [phone] Consults: [phone] Mon-Fri 7:00 am-4:30 pm Sat-Sun 7:00 am-11:30 am

## 2016-09-22 NOTE — Progress Notes (Signed)
Called Dr Billy Coastaavon, informed of reassuring NST, pt to be discharged today and [facility] Billy Coastaavon said he see pt this afternoon. Pt states baby is moving well, and denies any bleeding or leaking.

## 2016-09-23 NOTE — Discharge Summary (Signed)
[facility] Discharge Summary   Patient ID: Lauren Zhang MRN: [id number] DOB/AGE: 31/04/1986 31 y.o.  Admit date: 09/17/2016 Discharge date: 09/23/2016  Admitting Diagnosis: Intra-abdominal free air   Discharge Diagnosis Patient Active Problem List   Diagnosis Date Noted  . Intra-abdominal free air of unknown etiology 09/18/2016   Consultants Wound ostomy continence nurses - Jenel Lucksawn O Eagles, RN  Imaging: (09/18/16)CT ABD PELV W CONTRAST - 1. Small volume of pneumoperitoneum, as above. No definite source confidently identified on today's examination. 2. Gravid uterus with single IUP which is causing some mild compression of the distal ureters bilaterally resulting in some mild bilateral hydroureteronephrosis. 3. Status post appendectomy.  Procedures Dr. Sharlet SalinaBenjamin Hoxworth (09/18/16) - diagnostic laparoscopy, laparoscopic sigmoid colon resection, colostomy  Hospital Course:  31 year-old, pregnant (~27 weeks) female with no significant PMH weeks [facility]. Abdominal pain acutely worsened after receiving an enema 24-48h prior - raising concern for colonic perforation. CT scan significant for pneumoperitoneum. WBC was 14,400. Physical exam significant for diffuse abdominal tenderness. Patient was admitted and underwent procedure listed above. Tolerated procedure well and was transferred to the floor. Obstetrics evaluated the patient post-operatively and the pregnancy remained viable, daily NST's were performed and were not significant for fetal distress. Diet was advanced as tolerated.  Patient had return of bowel function on POD#4. On POD#4, the patient was voiding well, tolerating diet, ambulating well, pain well controlled, vital signs stable, incisions c/d/i and felt stable for discharge home.  Patient will follow up in our office in 2 weeks and knows to call with questions or concerns.  She will also follow up with her OB-Gyn. HH RN to see her  at home for ostomy care.   Allergies as of 09/22/2016   No Known Allergies     Medication List    TAKE these medications   acetaminophen 500 MG tablet Commonly known as:  TYLENOL Take 1,000 mg by mouth every 6 (six) hours as needed for mild pain.   bisacodyl 5 MG EC tablet Commonly known as:  DULCOLAX Take 5 mg by mouth once.   bisacodyl 10 MG suppository Commonly known as:  DULCOLAX Place 10 mg rectally once.   HYDROcodone-acetaminophen 10-325 MG tablet Commonly known as:  NORCO Take 1 tablet by mouth every 4 (four) hours as needed for moderate pain.   ondansetron 8 MG disintegrating tablet Commonly known as:  ZOFRAN ODT Take 1 tablet (8 mg total) by mouth every 8 (eight) hours as needed for nausea or vomiting.   polyethylene glycol packet Commonly known as:  MIRALAX / GLYCOLAX Take 17 g by mouth once.   prenatal multivitamin Tabs tablet Take 1 tablet by mouth daily at 12 noon.   promethazine 25 MG tablet Commonly known as:  PHENERGAN Take 0.5-1 tablets (12.5-25 mg total) by mouth every 6 (six) hours as needed.        Follow-up Information    HOXWORTH,BENJAMIN T, MD. Schedule an appointment as soon as possible for a visit in 2 week(s).   Specialty:  General Surgery Why:  for post-operative follow up Contact information: [address] [place] [address] [phone]        Lenoard AdenAAVON,[name], MD Follow up.   Specialty:  Obstetrics and Gynecology Contact information: [address] [place] [address] [phone]          Signed: Hosie SpangleElizabeth Amiracle Neises, [facility] 09/23/2016, 10:59 AM Pager: [phone] Consults: [phone] Mon-Fri 7:00 am-4:30 pm  Sat-Sun 7:00 am-11:30 am

## 2016-11-20 LAB — OB RESULTS CONSOLE GBS: STREP GROUP B AG: POSITIVE

## 2016-12-21 ENCOUNTER — Inpatient Hospital Stay (HOSPITAL_COMMUNITY): Payer: BLUE CROSS/BLUE SHIELD | Admitting: Anesthesiology

## 2016-12-21 ENCOUNTER — Inpatient Hospital Stay (HOSPITAL_COMMUNITY)
Admission: AD | Admit: 2016-12-21 | Discharge: 2016-12-23 | DRG: 775 | Disposition: A | Discharge status: Home / Self Care | Payer: BLUE CROSS/BLUE SHIELD | Source: Ambulatory Visit | Attending: Obstetrics and Gynecology | Admitting: Obstetrics and Gynecology

## 2016-12-21 ENCOUNTER — Encounter (HOSPITAL_COMMUNITY): Payer: Self-pay

## 2016-12-21 DIAGNOSIS — Z3A40 40 weeks gestation of pregnancy: Secondary | ICD-10-CM | POA: Diagnosis not present

## 2016-12-21 DIAGNOSIS — Z3493 Encounter for supervision of normal pregnancy, unspecified, third trimester: Secondary | ICD-10-CM | POA: Diagnosis present

## 2016-12-21 DIAGNOSIS — O99824 Streptococcus B carrier state complicating childbirth: Secondary | ICD-10-CM | POA: Diagnosis present

## 2016-12-21 DIAGNOSIS — Z3490 Encounter for supervision of normal pregnancy, unspecified, unspecified trimester: Secondary | ICD-10-CM

## 2016-12-21 LAB — TYPE AND SCREEN
ABO/RH(D): O POS
Antibody Screen: NEGATIVE

## 2016-12-21 LAB — CBC
HEMATOCRIT: 33.8 % — AB (ref 36.0–46.0)
Hemoglobin: 11.9 g/dL — ABNORMAL LOW (ref 12.0–15.0)
MCH: 31.2 pg (ref 26.0–34.0)
MCHC: 35.2 g/dL (ref 30.0–36.0)
MCV: 88.7 fL (ref 78.0–100.0)
Platelets: 207 10*3/uL (ref 150–400)
RBC: 3.81 MIL/uL — AB (ref 3.87–5.11)
RDW: 13.4 % (ref 11.5–15.5)
WBC: 11.3 10*3/uL — AB (ref 4.0–10.5)

## 2016-12-21 MED ORDER — PRENATAL MULTIVITAMIN CH
1.0000 | ORAL_TABLET | Freq: Every day | ORAL | Status: DC
  Filled 2016-12-21 (×2): qty 1

## 2016-12-21 MED ORDER — LIDOCAINE HCL (PF) 1 % IJ SOLN
INTRAMUSCULAR | Status: DC | PRN
  Administered 2016-12-21: 6 mL via EPIDURAL
  Administered 2016-12-21: 4 mL

## 2016-12-21 MED ORDER — EPHEDRINE 5 MG/ML INJ
10.0000 mg | INTRAVENOUS | Status: DC | PRN
Start: 2016-12-21 — End: 2016-12-21
  Filled 2016-12-21: qty 2

## 2016-12-21 MED ORDER — SOD CITRATE-CITRIC ACID 500-334 MG/5ML PO SOLN: 30.0000 mL | ORAL | Status: DC | PRN

## 2016-12-21 MED ORDER — COCONUT OIL OIL
1.0000 "application " | TOPICAL_OIL | Status: DC | PRN
  Administered 2016-12-23: 1 via TOPICAL
  Filled 2016-12-21: qty 120

## 2016-12-21 MED ORDER — ACETAMINOPHEN 325 MG PO TABS: 650.0000 mg | ORAL_TABLET | ORAL | Status: DC | PRN

## 2016-12-21 MED ORDER — SENNOSIDES-DOCUSATE SODIUM 8.6-50 MG PO TABS
2.0000 | ORAL_TABLET | ORAL | Status: DC
  Administered 2016-12-21 – 2016-12-22 (×2): 2 via ORAL
  Filled 2016-12-21 (×2): qty 2

## 2016-12-21 MED ORDER — OXYCODONE-ACETAMINOPHEN 5-325 MG PO TABS: 2.0000 | ORAL_TABLET | ORAL | Status: DC | PRN

## 2016-12-21 MED ORDER — LACTATED RINGERS IV SOLN
INTRAVENOUS | Status: DC
  Administered 2016-12-21: 125 mL via INTRAVENOUS

## 2016-12-21 MED ORDER — PHENYLEPHRINE 40 MCG/ML (10ML) SYRINGE FOR IV PUSH (FOR BLOOD PRESSURE SUPPORT)
80.0000 ug | PREFILLED_SYRINGE | INTRAVENOUS | Status: DC | PRN
  Filled 2016-12-21: qty 5

## 2016-12-21 MED ORDER — ONDANSETRON HCL 4 MG/2ML IJ SOLN: 4.0000 mg | INTRAMUSCULAR | Status: DC | PRN

## 2016-12-21 MED ORDER — LACTATED RINGERS IV SOLN
500.0000 mL | Freq: Once | INTRAVENOUS | Status: AC
  Administered 2016-12-21: 500 mL via INTRAVENOUS

## 2016-12-21 MED ORDER — METHYLERGONOVINE MALEATE 0.2 MG PO TABS: 0.2000 mg | ORAL_TABLET | ORAL | Status: DC | PRN

## 2016-12-21 MED ORDER — OXYCODONE-ACETAMINOPHEN 5-325 MG PO TABS: 1.0000 | ORAL_TABLET | ORAL | Status: DC | PRN

## 2016-12-21 MED ORDER — OXYTOCIN 40 UNITS IN LACTATED RINGERS INFUSION - SIMPLE MED
1.0000 m[IU]/min | INTRAVENOUS | Status: DC
  Administered 2016-12-21: 2 m[IU]/min via INTRAVENOUS

## 2016-12-21 MED ORDER — FLEET ENEMA 7-19 GM/118ML RE ENEM: 1.0000 | ENEMA | RECTAL | Status: DC | PRN

## 2016-12-21 MED ORDER — ONDANSETRON HCL 4 MG PO TABS: 4.0000 mg | ORAL_TABLET | ORAL | Status: DC | PRN

## 2016-12-21 MED ORDER — WITCH HAZEL-GLYCERIN EX PADS: 1.0000 "application " | MEDICATED_PAD | CUTANEOUS | Status: DC | PRN

## 2016-12-21 MED ORDER — ACETAMINOPHEN 325 MG PO TABS
650.0000 mg | ORAL_TABLET | ORAL | Status: DC | PRN
  Administered 2016-12-21 – 2016-12-22 (×4): 650 mg via ORAL
  Filled 2016-12-21 (×4): qty 2

## 2016-12-21 MED ORDER — DIPHENHYDRAMINE HCL 50 MG/ML IJ SOLN
12.5000 mg | INTRAMUSCULAR | Status: DC | PRN
  Administered 2016-12-21 (×2): 12.5 mg via INTRAVENOUS
  Filled 2016-12-21: qty 1

## 2016-12-21 MED ORDER — PENICILLIN G POT IN DEXTROSE 60000 UNIT/ML IV SOLN
3.0000 10*6.[IU] | INTRAVENOUS | Status: DC
  Administered 2016-12-21: 3 10*6.[IU] via INTRAVENOUS
  Filled 2016-12-21 (×5): qty 50

## 2016-12-21 MED ORDER — PHENYLEPHRINE 40 MCG/ML (10ML) SYRINGE FOR IV PUSH (FOR BLOOD PRESSURE SUPPORT)
80.0000 ug | PREFILLED_SYRINGE | INTRAVENOUS | Status: DC | PRN
  Filled 2016-12-21: qty 5
  Filled 2016-12-21: qty 10

## 2016-12-21 MED ORDER — EPHEDRINE 5 MG/ML INJ
10.0000 mg | INTRAVENOUS | Status: DC | PRN
  Filled 2016-12-21: qty 2

## 2016-12-21 MED ORDER — TERBUTALINE SULFATE 1 MG/ML IJ SOLN
0.2500 mg | Freq: Once | INTRAMUSCULAR | Status: DC | PRN
  Filled 2016-12-21: qty 1

## 2016-12-21 MED ORDER — BENZOCAINE-MENTHOL 20-0.5 % EX AERO
1.0000 "application " | INHALATION_SPRAY | CUTANEOUS | Status: DC | PRN
  Administered 2016-12-21: 1 via TOPICAL
  Filled 2016-12-21: qty 56

## 2016-12-21 MED ORDER — IBUPROFEN 600 MG PO TABS
600.0000 mg | ORAL_TABLET | Freq: Four times a day (QID) | ORAL | Status: DC
  Administered 2016-12-21 – 2016-12-23 (×9): 600 mg via ORAL
  Filled 2016-12-21 (×9): qty 1

## 2016-12-21 MED ORDER — PENICILLIN G POTASSIUM 5000000 UNITS IJ SOLR
5.0000 10*6.[IU] | Freq: Once | INTRAVENOUS | Status: AC
  Administered 2016-12-21: 5 10*6.[IU] via INTRAVENOUS
  Filled 2016-12-21: qty 5

## 2016-12-21 MED ORDER — LIDOCAINE HCL (PF) 1 % IJ SOLN
30.0000 mL | INTRAMUSCULAR | Status: DC | PRN
  Filled 2016-12-21: qty 30

## 2016-12-21 MED ORDER — SIMETHICONE 80 MG PO CHEW: 80.0000 mg | CHEWABLE_TABLET | ORAL | Status: DC | PRN

## 2016-12-21 MED ORDER — DIBUCAINE 1 % RE OINT: 1.0000 "application " | TOPICAL_OINTMENT | RECTAL | Status: DC | PRN

## 2016-12-21 MED ORDER — FENTANYL 2.5 MCG/ML BUPIVACAINE 1/10 % EPIDURAL INFUSION (WH - ANES)
14.0000 mL/h | INTRAMUSCULAR | Status: DC | PRN
  Administered 2016-12-21: 14 mL/h via EPIDURAL
  Filled 2016-12-21: qty 100

## 2016-12-21 MED ORDER — OXYTOCIN BOLUS FROM INFUSION
500.0000 mL | Freq: Once | INTRAVENOUS | Status: AC
  Administered 2016-12-21: 500 mL via INTRAVENOUS

## 2016-12-21 MED ORDER — LACTATED RINGERS IV SOLN
500.0000 mL | INTRAVENOUS | Status: DC | PRN
Start: 2016-12-21 — End: 2016-12-21

## 2016-12-21 MED ORDER — ONDANSETRON HCL 4 MG/2ML IJ SOLN: 4.0000 mg | Freq: Four times a day (QID) | INTRAMUSCULAR | Status: DC | PRN

## 2016-12-21 MED ORDER — ZOLPIDEM TARTRATE 5 MG PO TABS: 5.0000 mg | ORAL_TABLET | Freq: Every evening | ORAL | Status: DC | PRN

## 2016-12-21 MED ORDER — DIPHENHYDRAMINE HCL 25 MG PO CAPS: 25.0000 mg | ORAL_CAPSULE | Freq: Four times a day (QID) | ORAL | Status: DC | PRN

## 2016-12-21 MED ORDER — METHYLERGONOVINE MALEATE 0.2 MG/ML IJ SOLN: 0.2000 mg | INTRAMUSCULAR | Status: DC | PRN

## 2016-12-21 MED ORDER — TETANUS-DIPHTH-ACELL PERTUSSIS 5-2.5-18.5 LF-MCG/0.5 IM SUSP: 0.5000 mL | Freq: Once | INTRAMUSCULAR | Status: DC

## 2016-12-21 MED ORDER — OXYTOCIN 40 UNITS IN LACTATED RINGERS INFUSION - SIMPLE MED
2.5000 [IU]/h | INTRAVENOUS | Status: DC
  Filled 2016-12-21: qty 1000

## 2016-12-21 NOTE — MAU Note (Signed)
Patient presents to MAU with c/o contractions and possible leaking of fluid. States she is having contractions every 3-4 minutes that have got stronger since 10pm. Thinks water is broken but never had "big gush". GBS positive. +FM. Bloody show when wipes.

## 2016-12-21 NOTE — Lactation Note (Signed)
This note was copied from a baby's chart. Lactation Consultation Note  Patient Name: Lauren Zhang'X Date: 12/21/2016 Reason for consult: Initial assessment Baby at 4 hr of life. Upon entry RN was helping mom latch baby to the L breast in football. Lactation observed a few sucks before baby fell asleep and was placed sts with mom. Discussed baby behavior, feeding frequency, baby belly size, voids, wt loss, breast changes, and nipple care. Demonstrated manual expression, colostrum noted bilaterally, spoon in room. Given lactation handouts. Aware of OP services and support group.       Maternal Data Has patient been taught Hand Expression?: Yes Does the patient have breastfeeding experience prior to this delivery?: No  Feeding Feeding Type: Breast Fed Length of feed: 20 min  LATCH Score/Interventions Latch: Repeated attempts needed to sustain latch, nipple held in mouth throughout feeding, stimulation needed to elicit sucking reflex. Intervention(s): Adjust position;Assist with latch;Breast massage;Breast compression  Audible Swallowing: A few with stimulation Intervention(s): Skin to skin;Hand expression  Type of Nipple: Everted at rest and after stimulation  Comfort (Breast/Nipple): Filling, red/small blisters or bruises, mild/mod discomfort  Problem noted: Mild/Moderate discomfort Interventions (Mild/moderate discomfort): Hand massage;Hand expression  Hold (Positioning): Assistance needed to correctly position infant at breast and maintain latch. Intervention(s): Breastfeeding basics reviewed;Support Pillows;Position options;Skin to skin  LATCH Score: 6  Lactation Tools Discussed/Used     Consult Status Consult Status: Follow-up Date: 12/22/16 Follow-up type: In-patient    Rulon Eisenmenger 12/21/2016, 4:01 PM

## 2016-12-21 NOTE — Anesthesia Preprocedure Evaluation (Signed)

## 2016-12-21 NOTE — Anesthesia Postprocedure Evaluation (Signed)
Anesthesia Post Note  Patient: Lauren Zhang  Procedure(s) Performed: * No procedures listed *  Patient location during evaluation: Mother Baby Anesthesia Type: Epidural Level of consciousness: awake and alert and oriented Pain management: pain level controlled Vital Signs Assessment: post-procedure vital signs reviewed and stable Respiratory status: spontaneous breathing and nonlabored ventilation Cardiovascular status: stable Postop Assessment: no headache, patient able to bend at knees, no backache, no signs of nausea or vomiting, epidural receding and adequate PO intake Anesthetic complications: no        Last Vitals:  Vitals:   12/21/16 1415 12/21/16 1530  BP: 124/78 104/65  Pulse: 60 64  Resp: 18 16  Temp:  36.9 C    Last Pain:  Vitals:   12/21/16 1530  TempSrc: Oral  PainSc:    Pain Goal:                 Laban Emperor

## 2016-12-21 NOTE — Anesthesia Procedure Notes (Signed)
Epidural Patient location during procedure: OB  Staffing Anesthesiologist: Cristela Blue  Preanesthetic Checklist Completed: patient identified, site marked, surgical consent, pre-op evaluation, timeout performed, IV checked, risks and benefits discussed and monitors and equipment checked  Epidural Patient position: sitting Prep: site prepped and draped and DuraPrep Patient monitoring: continuous pulse ox and blood pressure Approach: midline Location: L2-L3 Injection technique: LOR air  Needle:  Needle type: Tuohy  Needle gauge: 17 G Needle length: 9 cm and 9 Needle insertion depth: 4 cm Catheter type: closed end flexible Catheter size: 19 Gauge Catheter at skin depth: 10 cm Test dose: negative  Assessment Events: blood not aspirated, injection not painful, no injection resistance, negative IV test and no paresthesia  Additional Notes Dosing of Epidural:  1st dose, through catheter ............................................Marland Kitchen  Xylocaine 40 mg  2nd dose, through catheter, after waiting 3 minutes........Marland KitchenXylocaine 60 mg    As each dose occurred, patient was free of IV sx; and patient exhibited no evidence of SA injection.  Patient is more comfortable after epidural dosed. Please see RN's note for documentation of vital signs,and FHR which are stable.  Patient reminded not to try to ambulate with numb legs, and that an RN must be present when she attempts to get up.

## 2016-12-21 NOTE — H&P (Signed)
Lauren Zhang is a 31 y.o. female presenting for labor. OB History    Gravida Para Term Preterm AB Living   2       1     SAB TAB Ectopic Multiple Live Births   1             Past Medical History:  Diagnosis Date  . Medical history non-contributory    Past Surgical History:  Procedure Laterality Date  . APPENDECTOMY  08/21/2006   [facility]. Dr.Sadiq  . COLON RESECTION N/A 09/18/2016   Procedure: LAPAROSCOPIC SIGMOID COLON RESECTION;  Surgeon: Glenna Fellows, MD;  Location: [facility] OR;  Service: General;  Laterality: N/A;  . COLOSTOMY N/A 09/18/2016   Procedure: COLOSTOMY;  Surgeon: Glenna Fellows, MD;  Location: [facility] OR;  Service: General;  Laterality: N/A;  . LAPAROSCOPY N/A 09/18/2016   Procedure: DIAGNOSTIC LAPAROSCOPY;  Surgeon: Glenna Fellows, MD;  Location: [facility] OR;  Service: General;  Laterality: N/A;   Family History: family history is not on file. Social History:  reports that she has never smoked. She has never used smokeless tobacco. She reports that she does not drink alcohol or use drugs.     Maternal Diabetes: No Genetic Screening: Normal Maternal Ultrasounds/Referrals: Normal Fetal Ultrasounds or other Referrals:  None Maternal Substance Abuse:  No Significant Maternal Medications:  Meds include: Other:  Significant Maternal Lab Results:  Lab values include: Group B Strep positive Other Comments:  None  Review of Systems  Constitutional: Negative.   All other systems reviewed and are negative.  Maternal Medical History:  Reason for admission: Contractions.   Contractions: Onset was 1-2 hours ago.   Frequency: regular.   Perceived severity is moderate.    Fetal activity: Perceived fetal activity is normal.   Last perceived fetal movement was within the past hour.    Prenatal complications: no prenatal complications Prenatal Complications - Diabetes: none.    Dilation: 5 Effacement (%): 90 Station: -1 Exam by:: Susy Manor, RN  Blood pressure  107/90, pulse 75, temperature 98.2 F (36.8 C), temperature source Oral, resp. rate 17, height  (1.702 m), weight 72.6 kg (160 lb), last menstrual period 03/15/2016, SpO2 99 %, unknown if currently breastfeeding. Maternal Exam:  Uterine Assessment: Contraction strength is moderate.  Contraction frequency is irregular.   Abdomen: Patient reports no abdominal tenderness. Surgical scars: laparoscopic scar.   Fetal presentation: vertex  Introitus: Normal vulva. Normal vagina.  Ferning test: not done.  Nitrazine test: not done. Amniotic fluid character: not assessed.  Pelvis: adequate for delivery.      Physical Exam  Nursing note and vitals reviewed. Constitutional: She is oriented to person, place, and time. She appears well-developed and well-nourished.  Neck: Normal range of motion. Neck supple.  Cardiovascular: Normal rate and regular rhythm.   Respiratory: Effort normal and breath sounds normal.  GI: Soft. Bowel sounds are normal.  Genitourinary: Vagina normal and uterus normal.  Musculoskeletal: Normal range of motion.  Neurological: She is alert and oriented to person, place, and time. She has normal reflexes.  Skin: Skin is warm and dry.  Psychiatric: She has a normal mood and affect.    Prenatal labs: ABO, Rh: --/--/O POS (04/02 0340) Antibody: NEG (04/02 0340) Rubella: Immune (08/24 0000) RPR: Nonreactive (08/24 0000)  HBsAg: Negative (08/24 0000)  HIV: Non-reactive (08/24 0000)  GBS: Positive (03/02 0000)   Assessment/Plan: Term IUP in active labor Colostomy for reversal pp Admit GBS prophylaxis   Lauren Zhang J 12/21/2016, 6:42 AM

## 2016-12-21 NOTE — MAU Note (Signed)
Pt. Presents with c/o of contractions that have been going on all day, just now getting consistent, 7-8 minutes apart.  Denies sudden gush of fluid, bloody show, positive for fetal movement.

## 2016-12-21 NOTE — MAU Note (Signed)
Pt. Transferred to L&D after notifying Dr. Billy Coast; standing orders entered.

## 2016-12-21 NOTE — Anesthesia Pain Management Evaluation Note (Signed)
  CRNA Pain Management Visit Note  Patient: Lauren Zhang, 31 y.o., female  "Hello I am a member of the anesthesia team at [facility]. We have an anesthesia team available at all times to provide care throughout the hospital, including epidural management and anesthesia for C-section. I don't know your plan for the delivery whether it a natural birth, water birth, IV sedation, nitrous supplementation, doula or epidural, but we want to meet your pain goals."   1.Was your pain managed to your expectations on prior hospitalizations?   No prior hospitalizations  2.What is your expectation for pain management during this hospitalization?     Epidural  3.How can we help you reach that goal? epidural  Record the patient's initial score and the patient's pain goal.   Pain: 2  Pain Goal: 4 [facility] wants you to be able to say your pain was always managed very well.  Marykate Heuberger 12/21/2016

## 2016-12-21 NOTE — Progress Notes (Signed)
Lauren Zhang is a 31 y.o. G2P0010 at [redacted]w[redacted]d by LMP admitted for active labor  Subjective: Comfortable.  Objective: BP 121/84   Pulse 81   Temp 97.8 F (36.6 C)   Resp 18   Ht  (1.702 m)   Wt 72.6 kg (160 lb)   LMP 03/15/2016   SpO2 99%   BMI 25.06 kg/m  No intake/output data recorded. No intake/output data recorded.  FHT:  FHR: 140 bpm, variability: moderate,  accelerations:  Present,  decelerations:  Absent UC:   irregular, every 3-6 minutes SVE:   Dilation: 5 Effacement (%): 90 Station: -1 Exam by:: Yehuda Budd, RN and C. Joseph Art, RN AROM-clear Labs: Lab Results  Component Value Date   WBC 11.3 (H) 12/21/2016   HGB 11.9 (L) 12/21/2016   HCT 33.8 (L) 12/21/2016   MCV 88.7 12/21/2016   PLT 207 12/21/2016    Assessment / Plan: Protracted active phase  Labor: add Pitocin Preeclampsia:  no signs or symptoms of toxicity Fetal Wellbeing:  Category I Pain Control:  Epidural I/D:  n/a Anticipated MOD:  NSVD  Versie Fleener J 12/21/2016, 9:34 AM

## 2016-12-22 LAB — CBC
HCT: 30.5 % — ABNORMAL LOW (ref 36.0–46.0)
Hemoglobin: 10.6 g/dL — ABNORMAL LOW (ref 12.0–15.0)
MCH: 31.2 pg (ref 26.0–34.0)
MCHC: 34.8 g/dL (ref 30.0–36.0)
MCV: 89.7 fL (ref 78.0–100.0)
Platelets: 172 10*3/uL (ref 150–400)
RBC: 3.4 MIL/uL — AB (ref 3.87–5.11)
RDW: 13.7 % (ref 11.5–15.5)
WBC: 9.1 10*3/uL (ref 4.0–10.5)

## 2016-12-22 LAB — RPR: RPR Ser Ql: NONREACTIVE

## 2016-12-22 NOTE — Lactation Note (Signed)
This note was copied from a baby's chart. Lactation Consultation Note: RN started mom with NS this morning due to pain with latch and baby not staying very long. Mom reports it helped the first feeding but not so much the second feeding. Has pumped and syringe fed Colostrum 2 times. Baby in nursery for hearing screen. Encouraged mom to page for assist when baby ready to feed again.   Patient Name: Boy Ersie Savino ZOXWR'U Date: 12/22/2016 Reason for consult: Follow-up assessment   Maternal Data    Feeding LATCH Score/Interventions  Consult Status Consult Status: Follow-up Date: 12/22/16 Follow-up type: In-patient    Pamelia Hoit 12/22/2016, 2:39 PM

## 2016-12-22 NOTE — Lactation Note (Signed)
This note was copied from a baby's chart. Lactation Consultation Note; Offered assist with latch and mom agreeable. Undressed baby, he is still sleepy. Placed in football hold. Took a few sucks then off to sleep. Tried with NS still he would not latch. Encouraged mom to pump and supplement with any EBM. States she feels comfortable with finger feeding with a curved tip syringe. I changed her to #24 flanges ( she was using #27) No questions at present. To call for assist when baby wakes. Encouraged to take nap this afternoon.   Patient Name: Lauren Zhang ZOXWR'U Date: 12/22/2016 Reason for consult: Follow-up assessment   Maternal Data Formula Feeding for Exclusion: No Has patient been taught Hand Expression?: Yes Does the patient have breastfeeding experience prior to this delivery?: No  Feeding Feeding Type: Breast Fed Length of feed: 5 min  LATCH Score/Interventions Latch: Too sleepy or reluctant, no latch achieved, no sucking elicited. (few sucks) Intervention(s): Adjust position;Assist with latch  Audible Swallowing: None Intervention(s): Skin to skin;Hand expression Intervention(s): Skin to skin;Alternate breast massage;Hand expression  Type of Nipple: Everted at rest and after stimulation  Comfort (Breast/Nipple): Soft / non-tender  Problem noted: Mild/Moderate discomfort Interventions (Mild/moderate discomfort): Hand expression;Post-pump  Hold (Positioning): Assistance needed to correctly position infant at breast and maintain latch. Intervention(s): Breastfeeding basics reviewed;Support Pillows;Position options;Skin to skin  LATCH Score: 5  Lactation Tools Discussed/Used Tools: Nipple Shields Nipple shield size: 24 Breast pump type: Double-Electric Breast Pump Initiated by:: RN Date initiated:: 12/22/16   Consult Status Consult Status: Follow-up Date: 12/23/16 Follow-up type: In-patient    Pamelia Hoit 12/22/2016, 3:24 PM

## 2016-12-22 NOTE — Lactation Note (Signed)
This note was copied from a baby's chart. Lactation Consultation Note  Patient Name: Lauren Zhang ZOXWR'U Date: 12/22/2016  [facility] followed up with mom at 34 hours of age.  Mom declines assist now. Mom is pumping and reports RN assisted with last latch. LC to follow tomorrow.      Maternal Data    Feeding Feeding Type: Breast Fed  LATCH Score/Interventions Latch: Repeated attempts needed to sustain latch, nipple held in mouth throughout feeding, stimulation needed to elicit sucking reflex. Intervention(s): Adjust position;Assist with latch;Breast massage;Breast compression  Audible Swallowing: A few with stimulation Intervention(s): Skin to skin Intervention(s): Skin to skin;Hand expression  Type of Nipple: Everted at rest and after stimulation  Comfort (Breast/Nipple): Soft / non-tender  Problem noted: Mild/Moderate discomfort Interventions (Mild/moderate discomfort): Hand massage  Hold (Positioning): Assistance needed to correctly position infant at breast and maintain latch. Intervention(s): Breastfeeding basics reviewed;Support Pillows;Position options;Skin to skin  LATCH Score: 7  Lactation Tools Discussed/Used     Consult Status      Lauren Zhang, Lauren Zhang 12/22/2016, 10:42 PM

## 2016-12-22 NOTE — Progress Notes (Signed)
PPD # 1 SVD Information for the patient's newborn:  Lauren Zhang, Lauren Zhang [[id number]]  female    breast feeding, working on latch / Circumcision considering for tomorrow with poor feeder Baby name: Lauren Zhang  S:  Reports feeling tired and sore.              Tolerating po/ No nausea or vomiting             Bleeding is heavy, changed several pads overnight, no clots.              Pain controlled with acetaminophen and ibuprofen (OTC)             Up ad lib / ambulatory / voiding without difficulties        O:  A & O x 3, in no apparent distress              VS:  Vitals:   12/21/16 1415 12/21/16 1530 12/21/16 1938 12/22/16 0446  BP: 124/78 104/65 120/79 121/72  Pulse: 60 64 60 (!) 54  Resp: Temp:  98.4 F (36.9 C) 98.2 F (36.8 C) 97.6 F (36.4 C)  TempSrc:  Oral Oral Oral  SpO2:      Weight:      Height:        LABS:  Recent Labs  12/21/16 0340 12/22/16 0538  WBC 11.3* 9.1  HGB 11.9* 10.6*  HCT 33.8* 30.5*  PLT 207 172    Blood type: --/--/O POS (04/02 0340)  Rubella: Immune (08/24 0000)   I&O: I/O last 3 completed shifts: In: -  Out: 900 [Urine:800; Blood:100]          No intake/output data recorded.  Lungs: Clear and unlabored  Heart: regular rate and rhythm / no murmurs  Abdomen: soft, non-tender, non-distended             Fundus: firm, non-tender, U+2 to R  Perineum: mild edema  Lochia: scant  Extremities: no edema, no calf pain or tenderness    A/P: PPD # 1 30 y.o., [id number]   Principal Problem:   Postpartum care following vaginal delivery 4/2 Active Problems:   Second-degree perineal laceration, with delivery S/P colostomy x 4 o, colon perf of unknown etiology, stable  Doing well - stable status  Encouraged frequent voids  Lactation support for latch, consider delay of circ until tomorrow for improved feeds.   Routine post partum orders  Anticipate discharge tomorrow    Neta Mends, MSN, CNM 12/22/2016, 8:40 AM

## 2016-12-23 MED ORDER — ACETAMINOPHEN 325 MG PO TABS: 650.0000 mg | ORAL_TABLET | ORAL | 0 refills | Status: AC | PRN

## 2016-12-23 MED ORDER — IBUPROFEN 600 MG PO TABS: 600.0000 mg | ORAL_TABLET | Freq: Four times a day (QID) | ORAL | 0 refills | Status: DC

## 2016-12-23 NOTE — Lactation Note (Addendum)
This note was copied from a baby's chart. Lactation Consultation Note  Patient Name: Lauren Zhang ZOXWR'U Date: 12/23/2016 Reason for consult: Follow-up assessment  Visited with Mom on day of possible discharge, baby 8 hrs old.  Baby has adequate output, and 7.3% weight loss.  Mom looks tired, and unsure about how baby is doing.  Baby did latch and feed earlier this morning for 17 minutes, describing a deeper latch using deep jaw extensions.   Mom has been pumping regularly, last pumping she expressed 8 ml and fed this to baby using a curved tip syringe.   Baby alert in FOB's arms.  Offered assist with laid back position. Baby has a high arched palate.  Initiated a 20 mm nipple shield (24 mm NS given, and found to be too large).  Baby tends to arch, and fuss rather than root and latch, mouth tight.  Encouraged suck training. Reassured Mom that using STS, regular pumping and hand expression will be the most helpful.  Mom has colostrum easily expressible.  Nipples pink and sensitive/sore per Mom.  Coconut oil to be given to Mom with instructions on use. Baby to be circumcised later today.  Discussed probable sleepiness associated with this.    Plan- 1- Keep baby STS, and try to latch with or without nipple shield 2- offer supplement of EBM+/Alimentum (volume parameters given) 3-Hand express, breast massage, and double pump 15-20 mins. 4- Outpatient Lactation appointment 12/25/16 @ 3:30 pm 5- Call Lactation for any questions.  Consult Status Consult Status: Follow-up Date: 12/25/16 Follow-up type: Out-patient    Judee Clara 12/23/2016, 10:01 AM

## 2016-12-23 NOTE — Progress Notes (Signed)
PPD # 2 SVD Information for the patient's newborn:  Lauren, Zhang [[id number]]  female    breast feeding, working on latch / Circumcision later today by Dr Dyanne Iha name: Lauren Zhang  S:  Reports feeling tired and sore.              Tolerating po/ No nausea or vomiting             Bleeding is heavy, changed several pads overnight, no clots.              Pain controlled with acetaminophen and ibuprofen (OTC)             Up ad lib / ambulatory / voiding without difficulties        O:  A & O x 3, in no apparent distress              VS:  Vitals:   12/21/16 1938 12/22/16 0446 12/22/16 1832 12/23/16 0611  BP: 120/79 121/72 128/75 119/82  Pulse: 60 (!) 54 84 74  Resp: Temp: 98.2 F (36.8 C) 97.6 F (36.4 C) 98.7 F (37.1 C) 98.3 F (36.8 C)  TempSrc: Oral Oral Oral Oral  SpO2:      Weight:      Height:        LABS:   Recent Labs  12/21/16 0340 12/22/16 0538  WBC 11.3* 9.1  HGB 11.9* 10.6*  HCT 33.8* 30.5*  PLT 207 172    Blood type: --/--/O POS (04/02 0340)  Rubella: Immune (08/24 0000)   I&O: No intake/output data recorded.          No intake/output data recorded.  Lungs: Clear and unlabored  Heart: regular rate and rhythm / no murmurs  Abdomen: soft, non-tender, non-distended             Fundus: firm, non-tender, U-2  Perineum: mild edema  Lochia: scant  Extremities: no edema, no calf pain or tenderness    A/P: PPD # 2 30 y.o., [id number]   Principal Problem:   Postpartum care following vaginal delivery 4/2 Active Problems:   Second-degree perineal laceration, with delivery S/P colostomy x 4 o, colon perf of unknown etiology, stable  pericare and pp care reviewed.   Discharge now   F/up with Dr Billy Coast in 6 wks, precautions and warning signs reviewed  Lauren Rawlinson R, MD 12/23/2016, 11:14 AM

## 2016-12-23 NOTE — Discharge Summary (Signed)
Obstetric Discharge Summary Reason for Admission: onset of labor Prenatal Procedures: ultrasound Intrapartum Procedures: spontaneous vaginal delivery Postpartum Procedures: none Complications-Operative and Postpartum: none 2nd degree perineal laceration   Hemoglobin  Date Value Ref Range Status  12/22/2016 10.6 (L) 12.0 - 15.0 g/dL Final   HCT  Date Value Ref Range Status  12/22/2016 30.5 (L) 36.0 - 46.0 % Final    Physical Exam:  General: alert and cooperative Lochia: appropriate Uterine Fundus: firm Incision: no significant drainage, no dehiscence DVT Evaluation: No evidence of DVT seen on physical exam.  Discharge Diagnoses: Term Pregnancy-delivered  Discharge Information: Date: 12/23/2016 Activity: unrestricted and pelvic rest Diet: routine Medications: PNV, Ibuprofen and tylenol  Condition: stable Instructions: refer to practice specific booklet and hospital booklet. Also eat iron rich diet to improve anemia as you do not tolerate iron well   Discharge to: home Follow-up Information    Zhang,Lauren J, MD Follow up in 6 week(s).   Specialty:  Obstetrics and Gynecology Contact information: [address] [place] [address] [phone]           Newborn Data: Live born female - circumcision before discharge by Dr Billy Coast Birth Weight: 7 lb 9 oz (3430 g) APGAR: 7, 8  Home with mother.  Lauren Zhang R 12/23/2016, 11:19 AM

## 2017-03-08 ENCOUNTER — Ambulatory Visit: Payer: Self-pay | Admitting: General Surgery

## 2017-03-08 NOTE — Progress Notes (Signed)
Please place orders in EPIC as patient is being scheduled for a pre-op appointment! Thank you! 

## 2017-03-12 ENCOUNTER — Ambulatory Visit: Payer: Self-pay | Admitting: General Surgery

## 2017-03-12 NOTE — Progress Notes (Signed)
EKG 09-17-16 epic

## 2017-03-12 NOTE — Patient Instructions (Signed)
Lauren Zhang  03/12/2017   Your procedure is scheduled on: 03-19-17  [facility]  Entrance [place]  elevators to 3rd floor to  Short Stay Center at 730AM.   Call this number if you have problems the morning of surgery [phone]    Remember: ONLY 1 PERSON MAY GO WITH YOU TO SHORT STAY TO GET  READY MORNING OF YOUR SURGERY.  Do not eat food After Midnight on Wednesday 03-17-17. Drink plenty of clear liquids all day Thursday 03-18-17 and follow all bowel prep instructions. Nothing by mouth after midnight on Thursday !     Take these medicines the morning of surgery with A SIP OF WATER: none                                You may not have any metal on your body including hair pins and              piercings  Do not wear jewelry, make-up, lotions, powders or perfumes, deodorant             Do not wear nail polish.  Do not shave  48 hours prior to surgery.               Do not bring valuables to the hospital. [facility] IS NOT             RESPONSIBLE   FOR VALUABLES.  Contacts, dentures or bridgework may not be worn into surgery.  Leave suitcase in the car. After surgery it may be brought to your room.                Please read over the following fact sheets you were given: _____________________________________________________________________   CLEAR LIQUID DIET   Foods Allowed                                                                     Foods Excluded  Coffee and tea, regular and decaf                             liquids that you cannot  Plain Jell-O in any flavor                                             see through such as: Fruit ices (not with fruit pulp)                                     milk, soups, orange juice  Iced Popsicles                                    All solid food Carbonated beverages, regular and diet  Cranberry, grape and apple juices Sports drinks like  Gatorade Lightly seasoned clear broth or consume(fat free) Sugar, honey syrup  Sample Menu Breakfast                                Lunch                                     Supper Cranberry juice                    Beef broth                            Chicken broth Jell-O                                     Grape juice                           Apple juice Coffee or tea                        Jell-O                                      Popsicle                                                Coffee or tea                        Coffee or tea  _____________________________________________________________________  [facility] - Preparing for Surgery Before surgery, you can play an important role.  Because skin is not sterile, your skin needs to be as free of germs as possible.  You can reduce the number of germs on your skin by washing with CHG (chlorahexidine gluconate) soap before surgery.  CHG is an antiseptic cleaner which kills germs and bonds with the skin to continue killing germs even after washing. Please DO NOT use if you have an allergy to CHG or antibacterial soaps.  If your skin becomes reddened/irritated stop using the CHG and inform your nurse when you arrive at Short Stay. Do not shave (including legs and underarms) for at least 48 hours prior to the first CHG shower.  You may shave your face/neck. Please follow these instructions carefully:  1.  Shower with CHG Soap the night before surgery and the  morning of Surgery.  2.  If you choose to wash your hair, wash your hair first as usual with your  normal  shampoo.  3.  After you shampoo, rinse your hair and body thoroughly to remove the  shampoo.                           4.  Use CHG as you would any other liquid soap.  You can apply chg directly  to the skin and wash  Gently with a scrungie or clean washcloth.  5.  Apply the CHG Soap to your body ONLY FROM THE NECK DOWN.   Do not use on face/ open                            Wound or open sores. Avoid contact with eyes, ears mouth and genitals (private parts).                       Wash face,  Genitals (private parts) with your normal soap.             6.  Wash thoroughly, paying special attention to the area where your surgery  will be performed.  7.  Thoroughly rinse your body with warm water from the neck down.  8.  DO NOT shower/wash with your normal soap after using and rinsing off  the CHG Soap.                9.  Pat yourself dry with a clean towel.            10.  Wear clean pajamas.            11.  Place clean sheets on your bed the night of your first shower and do not  sleep with pets. Day of Surgery : Do not apply any lotions/deodorants the morning of surgery.  Please wear clean clothes to the hospital/surgery center.  FAILURE TO FOLLOW THESE INSTRUCTIONS MAY RESULT IN THE CANCELLATION OF YOUR SURGERY PATIENT SIGNATURE_________________________________  NURSE SIGNATURE__________________________________  ________________________________________________________________________

## 2017-03-15 ENCOUNTER — Encounter (HOSPITAL_COMMUNITY)
Admission: RE | Admit: 2017-03-15 | Discharge: 2017-03-15 | Disposition: A | Discharge status: Home / Self Care | Payer: BLUE CROSS/BLUE SHIELD | Source: Ambulatory Visit | Attending: General Surgery | Admitting: General Surgery

## 2017-03-15 ENCOUNTER — Encounter (HOSPITAL_COMMUNITY): Payer: Self-pay

## 2017-03-15 DIAGNOSIS — Z01818 Encounter for other preprocedural examination: Secondary | ICD-10-CM | POA: Diagnosis not present

## 2017-03-15 HISTORY — DX: Perforation of intestine (nontraumatic): K63.1

## 2017-03-15 LAB — CBC
HCT: 34.9 % — ABNORMAL LOW (ref 36.0–46.0)
Hemoglobin: 11.9 g/dL — ABNORMAL LOW (ref 12.0–15.0)
MCH: 29.9 pg (ref 26.0–34.0)
MCHC: 34.1 g/dL (ref 30.0–36.0)
MCV: 87.7 fL (ref 78.0–100.0)
Platelets: 245 10*3/uL (ref 150–400)
RBC: 3.98 MIL/uL (ref 3.87–5.11)
RDW: 12.5 % (ref 11.5–15.5)
WBC: 5.3 10*3/uL (ref 4.0–10.5)

## 2017-03-15 LAB — ABO/RH: ABO/RH(D): O POS

## 2017-03-15 LAB — HCG, SERUM, QUALITATIVE: Preg, Serum: NEGATIVE

## 2017-03-19 ENCOUNTER — Inpatient Hospital Stay (HOSPITAL_COMMUNITY): Payer: BLUE CROSS/BLUE SHIELD | Admitting: Registered Nurse

## 2017-03-19 ENCOUNTER — Encounter (HOSPITAL_COMMUNITY)
Admission: RE | Disposition: A | Discharge status: Home / Self Care | Payer: Self-pay | Source: Ambulatory Visit | Attending: General Surgery

## 2017-03-19 ENCOUNTER — Encounter (HOSPITAL_COMMUNITY): Payer: Self-pay | Admitting: *Deleted

## 2017-03-19 ENCOUNTER — Inpatient Hospital Stay (HOSPITAL_COMMUNITY)
Admission: RE | Admit: 2017-03-19 | Discharge: 2017-03-22 | DRG: 346 | Disposition: A | Discharge status: Home / Self Care | Payer: BLUE CROSS/BLUE SHIELD | Source: Ambulatory Visit | Attending: General Surgery | Admitting: General Surgery

## 2017-03-19 DIAGNOSIS — Z433 Encounter for attention to colostomy: Secondary | ICD-10-CM | POA: Diagnosis present

## 2017-03-19 DIAGNOSIS — Z933 Colostomy status: Secondary | ICD-10-CM

## 2017-03-19 HISTORY — PX: COLOSTOMY TAKEDOWN: SHX5258

## 2017-03-19 LAB — TYPE AND SCREEN
ABO/RH(D): O POS
ANTIBODY SCREEN: NEGATIVE

## 2017-03-19 SURGERY — CLOSURE, COLOSTOMY, LAPAROSCOPIC
Anesthesia: General | Site: Abdomen

## 2017-03-19 MED ORDER — NORETHINDRONE 0.35 MG PO TABS
1.0000 | ORAL_TABLET | Freq: Every day | ORAL | Status: DC
  Administered 2017-03-19: 0.35 mg via ORAL

## 2017-03-19 MED ORDER — FENTANYL CITRATE (PF) 100 MCG/2ML IJ SOLN
INTRAMUSCULAR | Status: DC | PRN
  Administered 2017-03-19: 50 ug via INTRAVENOUS
  Administered 2017-03-19: 100 ug via INTRAVENOUS
  Administered 2017-03-19 (×2): 50 ug via INTRAVENOUS

## 2017-03-19 MED ORDER — HYDROMORPHONE HCL 1 MG/ML IJ SOLN
0.5000 mg | INTRAMUSCULAR | Status: DC | PRN
  Administered 2017-03-19: 1 mg via INTRAVENOUS
  Administered 2017-03-19: 0.5 mg via INTRAVENOUS
  Administered 2017-03-19 – 2017-03-20 (×2): 1 mg via INTRAVENOUS
  Filled 2017-03-19 (×4): qty 1

## 2017-03-19 MED ORDER — BUPIVACAINE-EPINEPHRINE (PF) 0.25% -1:200000 IJ SOLN
INTRAMUSCULAR | Status: AC
  Filled 2017-03-19: qty 30

## 2017-03-19 MED ORDER — ONDANSETRON HCL 4 MG/2ML IJ SOLN
4.0000 mg | Freq: Four times a day (QID) | INTRAMUSCULAR | Status: DC | PRN
  Administered 2017-03-19 (×2): 4 mg via INTRAVENOUS
  Filled 2017-03-19 (×2): qty 2

## 2017-03-19 MED ORDER — LIDOCAINE 2% (20 MG/ML) 5 ML SYRINGE
INTRAMUSCULAR | Status: AC
  Filled 2017-03-19: qty 5

## 2017-03-19 MED ORDER — MEPERIDINE HCL 50 MG/ML IJ SOLN: 6.2500 mg | INTRAMUSCULAR | Status: DC | PRN

## 2017-03-19 MED ORDER — BUPIVACAINE-EPINEPHRINE (PF) 0.25% -1:200000 IJ SOLN
INTRAMUSCULAR | Status: DC | PRN
  Administered 2017-03-19: 40 mL

## 2017-03-19 MED ORDER — HYDROMORPHONE HCL 1 MG/ML IJ SOLN
0.2500 mg | INTRAMUSCULAR | Status: DC | PRN
  Administered 2017-03-19: 0.25 mg via INTRAVENOUS

## 2017-03-19 MED ORDER — MIDAZOLAM HCL 5 MG/5ML IJ SOLN
INTRAMUSCULAR | Status: DC | PRN
  Administered 2017-03-19: 2 mg via INTRAVENOUS

## 2017-03-19 MED ORDER — PROMETHAZINE HCL 25 MG/ML IJ SOLN
6.2500 mg | INTRAMUSCULAR | Status: DC | PRN
  Administered 2017-03-19: 6.25 mg via INTRAVENOUS

## 2017-03-19 MED ORDER — ONDANSETRON HCL 4 MG/2ML IJ SOLN
INTRAMUSCULAR | Status: AC
  Filled 2017-03-19: qty 2

## 2017-03-19 MED ORDER — KETOROLAC TROMETHAMINE 30 MG/ML IJ SOLN: 30.0000 mg | Freq: Once | INTRAMUSCULAR | Status: DC | PRN

## 2017-03-19 MED ORDER — MIDAZOLAM HCL 2 MG/2ML IJ SOLN
INTRAMUSCULAR | Status: AC
  Filled 2017-03-19: qty 2

## 2017-03-19 MED ORDER — HYDROMORPHONE HCL 1 MG/ML IJ SOLN
INTRAMUSCULAR | Status: AC
  Filled 2017-03-19: qty 1

## 2017-03-19 MED ORDER — POLYETHYLENE GLYCOL 3350 17 GM/SCOOP PO POWD
1.0000 | Freq: Once | ORAL | Status: DC
  Filled 2017-03-19: qty 255

## 2017-03-19 MED ORDER — NEOMYCIN SULFATE 500 MG PO TABS
1000.0000 mg | ORAL_TABLET | ORAL | Status: DC
  Filled 2017-03-19: qty 2

## 2017-03-19 MED ORDER — ROCURONIUM BROMIDE 50 MG/5ML IV SOSY
PREFILLED_SYRINGE | INTRAVENOUS | Status: AC
  Filled 2017-03-19: qty 5

## 2017-03-19 MED ORDER — PROMETHAZINE HCL 25 MG/ML IJ SOLN
INTRAMUSCULAR | Status: AC
  Filled 2017-03-19: qty 1

## 2017-03-19 MED ORDER — ONDANSETRON HCL 4 MG/2ML IJ SOLN
INTRAMUSCULAR | Status: DC | PRN
  Administered 2017-03-19: 4 mg via INTRAVENOUS

## 2017-03-19 MED ORDER — ACETAMINOPHEN 10 MG/ML IV SOLN
INTRAVENOUS | Status: DC | PRN
  Administered 2017-03-19: 1000 mg via INTRAVENOUS

## 2017-03-19 MED ORDER — DEXAMETHASONE SODIUM PHOSPHATE 10 MG/ML IJ SOLN
INTRAMUSCULAR | Status: AC
  Filled 2017-03-19: qty 1

## 2017-03-19 MED ORDER — HYDROMORPHONE HCL 1 MG/ML IJ SOLN
INTRAMUSCULAR | Status: DC | PRN
  Administered 2017-03-19: .4 mg via INTRAVENOUS

## 2017-03-19 MED ORDER — FENTANYL CITRATE (PF) 250 MCG/5ML IJ SOLN
INTRAMUSCULAR | Status: AC
  Filled 2017-03-19: qty 5

## 2017-03-19 MED ORDER — OXYCODONE-ACETAMINOPHEN 5-325 MG PO TABS
1.0000 | ORAL_TABLET | ORAL | Status: DC | PRN
  Administered 2017-03-20 (×3): 2 via ORAL
  Administered 2017-03-20: 1 via ORAL
  Filled 2017-03-19 (×2): qty 2
  Filled 2017-03-19: qty 1
  Filled 2017-03-19 (×2): qty 2

## 2017-03-19 MED ORDER — METRONIDAZOLE 500 MG PO TABS
1000.0000 mg | ORAL_TABLET | ORAL | Status: DC
  Filled 2017-03-19: qty 2

## 2017-03-19 MED ORDER — BUPIVACAINE LIPOSOME 1.3 % IJ SUSP
20.0000 mL | Freq: Once | INTRAMUSCULAR | Status: AC
  Administered 2017-03-19: 20 mL
  Filled 2017-03-19: qty 20

## 2017-03-19 MED ORDER — PROPOFOL 10 MG/ML IV BOLUS
INTRAVENOUS | Status: DC | PRN
  Administered 2017-03-19: 150 mg via INTRAVENOUS

## 2017-03-19 MED ORDER — EVICEL 5 ML EX KIT
PACK | Freq: Once | CUTANEOUS | Status: DC
  Filled 2017-03-19: qty 1

## 2017-03-19 MED ORDER — 0.9 % SODIUM CHLORIDE (POUR BTL) OPTIME
TOPICAL | Status: DC | PRN
  Administered 2017-03-19: 2000 mL

## 2017-03-19 MED ORDER — SODIUM CHLORIDE 0.9 % IJ SOLN
INTRAMUSCULAR | Status: AC
  Filled 2017-03-19: qty 10

## 2017-03-19 MED ORDER — BUPIVACAINE HCL (PF) 0.5 % IJ SOLN
INTRAMUSCULAR | Status: DC | PRN
  Administered 2017-03-19: 18 mL

## 2017-03-19 MED ORDER — SUGAMMADEX SODIUM 200 MG/2ML IV SOLN
INTRAVENOUS | Status: AC
  Filled 2017-03-19: qty 2

## 2017-03-19 MED ORDER — ONDANSETRON HCL 4 MG PO TABS: 4.0000 mg | ORAL_TABLET | Freq: Four times a day (QID) | ORAL | Status: DC | PRN

## 2017-03-19 MED ORDER — SUGAMMADEX SODIUM 200 MG/2ML IV SOLN
INTRAVENOUS | Status: DC | PRN
  Administered 2017-03-19: 120 mg via INTRAVENOUS

## 2017-03-19 MED ORDER — DEXTROSE 5 % IV SOLN
2.0000 g | INTRAVENOUS | Status: AC
  Administered 2017-03-19: 2 g via INTRAVENOUS
  Filled 2017-03-19: qty 2

## 2017-03-19 MED ORDER — ALVIMOPAN 12 MG PO CAPS
12.0000 mg | ORAL_CAPSULE | ORAL | Status: AC
  Administered 2017-03-19: 12 mg via ORAL
  Filled 2017-03-19: qty 1

## 2017-03-19 MED ORDER — DEXAMETHASONE SODIUM PHOSPHATE 10 MG/ML IJ SOLN
INTRAMUSCULAR | Status: DC | PRN
  Administered 2017-03-19: 10 mg via INTRAVENOUS

## 2017-03-19 MED ORDER — BUPIVACAINE HCL (PF) 0.5 % IJ SOLN
INTRAMUSCULAR | Status: AC
  Filled 2017-03-19: qty 30

## 2017-03-19 MED ORDER — ALVIMOPAN 12 MG PO CAPS
12.0000 mg | ORAL_CAPSULE | Freq: Two times a day (BID) | ORAL | Status: DC
  Administered 2017-03-20 – 2017-03-21 (×3): 12 mg via ORAL
  Filled 2017-03-19 (×3): qty 1

## 2017-03-19 MED ORDER — LIDOCAINE 2% (20 MG/ML) 5 ML SYRINGE
INTRAMUSCULAR | Status: DC | PRN
  Administered 2017-03-19: 100 mg via INTRAVENOUS

## 2017-03-19 MED ORDER — ENOXAPARIN SODIUM 40 MG/0.4ML ~~LOC~~ SOLN
40.0000 mg | SUBCUTANEOUS | Status: DC
  Administered 2017-03-20 – 2017-03-21 (×2): 40 mg via SUBCUTANEOUS
  Filled 2017-03-19 (×2): qty 0.4

## 2017-03-19 MED ORDER — SODIUM CHLORIDE 0.9 % IV SOLN: 20.0000 mL | Freq: Once | INTRAVENOUS | Status: DC

## 2017-03-19 MED ORDER — LACTATED RINGERS IV SOLN
INTRAVENOUS | Status: DC
  Administered 2017-03-19 – 2017-03-21 (×5): via INTRAVENOUS

## 2017-03-19 MED ORDER — CHLORHEXIDINE GLUCONATE CLOTH 2 % EX PADS: 6.0000 | MEDICATED_PAD | Freq: Once | CUTANEOUS | Status: DC

## 2017-03-19 MED ORDER — LACTATED RINGERS IV SOLN
INTRAVENOUS | Status: DC
  Administered 2017-03-19: 08:00:00 via INTRAVENOUS

## 2017-03-19 MED ORDER — HYDROMORPHONE HCL 2 MG/ML IJ SOLN
INTRAMUSCULAR | Status: AC
  Filled 2017-03-19: qty 1

## 2017-03-19 MED ORDER — EPHEDRINE 5 MG/ML INJ
INTRAVENOUS | Status: AC
  Filled 2017-03-19: qty 10

## 2017-03-19 MED ORDER — EPHEDRINE SULFATE-NACL 50-0.9 MG/10ML-% IV SOSY
PREFILLED_SYRINGE | INTRAVENOUS | Status: DC | PRN
  Administered 2017-03-19 (×2): 5 mg via INTRAVENOUS

## 2017-03-19 MED ORDER — PROPOFOL 10 MG/ML IV BOLUS
INTRAVENOUS | Status: AC
  Filled 2017-03-19: qty 20

## 2017-03-19 MED ORDER — PRENATAL MULTIVITAMIN CH
1.0000 | ORAL_TABLET | Freq: Every day | ORAL | Status: DC
  Filled 2017-03-19 (×4): qty 1

## 2017-03-19 MED ORDER — ACETAMINOPHEN 10 MG/ML IV SOLN
INTRAVENOUS | Status: AC
Start: 2017-03-19 — End: 2017-03-19
  Filled 2017-03-19: qty 100

## 2017-03-19 MED ORDER — ROCURONIUM BROMIDE 10 MG/ML (PF) SYRINGE
PREFILLED_SYRINGE | INTRAVENOUS | Status: DC | PRN
  Administered 2017-03-19 (×2): 10 mg via INTRAVENOUS
  Administered 2017-03-19: 30 mg via INTRAVENOUS
  Administered 2017-03-19: 10 mg via INTRAVENOUS
  Administered 2017-03-19: 20 mg via INTRAVENOUS

## 2017-03-19 SURGICAL SUPPLY — 59 items
APPLIER CLIP ROT 10 11.4 M/L (STAPLE)
BLADE HEX COATED 2.75 (ELECTRODE) ×3 IMPLANT
CLIP APPLIE ROT 10 11.4 M/L (STAPLE) IMPLANT
COVER MAYO STAND STRL (DRAPES) ×3 IMPLANT
COVER SURGICAL LIGHT HANDLE (MISCELLANEOUS) ×3 IMPLANT
DERMABOND ADVANCED (GAUZE/BANDAGES/DRESSINGS)
DERMABOND ADVANCED .7 DNX12 (GAUZE/BANDAGES/DRESSINGS) IMPLANT
DRAPE LAPAROSCOPIC ABDOMINAL (DRAPES) IMPLANT
DRAPE WARM FLUID 44X44 (DRAPE) ×3 IMPLANT
ELECT PENCIL ROCKER SW 15FT (MISCELLANEOUS) ×3 IMPLANT
ELECT REM PT RETURN 15FT ADLT (MISCELLANEOUS) ×3 IMPLANT
ENDOLOOP SUT PDS II  0 18 (SUTURE)
ENDOLOOP SUT PDS II 0 18 (SUTURE) IMPLANT
GAUZE SPONGE 4X4 12PLY STRL (GAUZE/BANDAGES/DRESSINGS) ×3 IMPLANT
GLOVE BIOGEL PI IND STRL 7.0 (GLOVE) ×1 IMPLANT
GLOVE BIOGEL PI INDICATOR 7.0 (GLOVE) ×2
GOWN STRL REUS W/TWL LRG LVL3 (GOWN DISPOSABLE) ×3 IMPLANT
GOWN STRL REUS W/TWL XL LVL3 (GOWN DISPOSABLE) ×6 IMPLANT
IRRIG SUCT STRYKERFLOW 2 WTIP (MISCELLANEOUS) ×3
IRRIGATION SUCT STRKRFLW 2 WTP (MISCELLANEOUS) ×1 IMPLANT
KIT BASIN OR (CUSTOM PROCEDURE TRAY) ×3 IMPLANT
LEGGING LITHOTOMY PAIR STRL (DRAPES) ×3 IMPLANT
MARKER SKIN DUAL TIP RULER LAB (MISCELLANEOUS) ×3 IMPLANT
NS IRRIG 1000ML POUR BTL (IV SOLUTION) ×6 IMPLANT
PACK COLON (CUSTOM PROCEDURE TRAY) ×3 IMPLANT
PAD POSITIONING PINK XL (MISCELLANEOUS) ×3 IMPLANT
PORT LAP GEL ALEXIS MED 5-9CM (MISCELLANEOUS) ×3 IMPLANT
POSITIONER SURGICAL ARM (MISCELLANEOUS) IMPLANT
SCISSORS LAP 5X35 DISP (ENDOMECHANICALS) ×3 IMPLANT
SEALER TISSUE X1 CVD JAW (INSTRUMENTS) IMPLANT
SHEARS HARMONIC ACE PLUS 36CM (ENDOMECHANICALS) ×3 IMPLANT
SLEEVE XCEL OPT CAN 5 100 (ENDOMECHANICALS) ×3 IMPLANT
SPONGE LAP 18X18 X RAY DECT (DISPOSABLE) ×6 IMPLANT
STAPLER VISISTAT 35W (STAPLE) ×3 IMPLANT
SUT MNCRL AB 4-0 PS2 18 (SUTURE) IMPLANT
SUT MON AB 2-0 CT1 36 (SUTURE) ×3 IMPLANT
SUT PDS AB 0 CT 36 (SUTURE) ×3 IMPLANT
SUT PDS AB 1 CT1 27 (SUTURE) ×3 IMPLANT
SUT PDS AB 2-0 CT2 27 (SUTURE) ×6 IMPLANT
SUT SILK 2 0 (SUTURE) ×2
SUT SILK 2 0 SH CR/8 (SUTURE) ×15 IMPLANT
SUT SILK 2-0 18XBRD TIE 12 (SUTURE) ×1 IMPLANT
SUT SILK 3 0 (SUTURE) ×2
SUT SILK 3 0 SH CR/8 (SUTURE) ×3 IMPLANT
SUT SILK 3-0 18XBRD TIE 12 (SUTURE) ×1 IMPLANT
SUT VIC AB 3-0 SH 18 (SUTURE) ×3 IMPLANT
SUT VICRYL 0 UR6 27IN ABS (SUTURE) ×3 IMPLANT
TAPE CLOTH 4X10 WHT NS (GAUZE/BANDAGES/DRESSINGS) IMPLANT
TOWEL OR 17X26 10 PK STRL BLUE (TOWEL DISPOSABLE) ×6 IMPLANT
TRAY FOLEY W/METER SILVER 16FR (SET/KITS/TRAYS/PACK) ×3 IMPLANT
TRAY LAPAROSCOPIC (CUSTOM PROCEDURE TRAY) ×3 IMPLANT
TROCAR XCEL 12X100 BLDLESS (ENDOMECHANICALS) ×3 IMPLANT
TROCAR XCEL BLUNT TIP 100MML (ENDOMECHANICALS) ×3 IMPLANT
TROCAR XCEL NON-BLD 11X100MML (ENDOMECHANICALS) IMPLANT
TROCAR XCEL UNIV SLVE 11M 100M (ENDOMECHANICALS) IMPLANT
TUBING CONNECTING 10 (TUBING) ×4 IMPLANT
TUBING CONNECTING 10' (TUBING) ×2
TUBING INSUF HEATED (TUBING) ×3 IMPLANT
YANKAUER SUCT BULB TIP 10FT TU (MISCELLANEOUS) IMPLANT

## 2017-03-19 NOTE — Anesthesia Procedure Notes (Signed)
Procedure Name: Intubation Date/Time: 03/19/2017 9:41 AM Performed by: Carleene Cooper A Pre-anesthesia Checklist: Patient identified, Suction available, Patient being monitored, Timeout performed and Emergency Drugs available Patient Re-evaluated:Patient Re-evaluated prior to inductionOxygen Delivery Method: Circle system utilized Preoxygenation: Pre-oxygenation with 100% oxygen Intubation Type: IV induction Ventilation: Mask ventilation without difficulty Laryngoscope Size: Mac and 4 Grade View: Grade I Tube type: Oral Tube size: 7.5 mm Number of attempts: 1 Airway Equipment and Method: Stylet Placement Confirmation: ETT inserted through vocal cords under direct vision,  positive ETCO2 and breath sounds checked- equal and bilateral Secured at: 21 cm Tube secured with: Tape Dental Injury: Teeth and Oropharynx as per pre-operative assessment

## 2017-03-19 NOTE — Op Note (Signed)
Preoperative Diagnosis: status post harmann colostomy  Postoprative Diagnosis: status post harmann colostomy  Procedure: Procedure(s): LAPAROSCOPIC TAKEDOWN HARTMANNS COLOSTOMY   Surgeon: Glenna FellowsHoxworth, Jovee Dettinger T   Assistants: Gaynelle AduEric Wilson  Anesthesia:  General endotracheal anesthesia  Indications: Patient is a 31 year old female who approximately 6 months ago underwent emergency laparoscopic Hartmann colectomy for perforation of the proximal sigmoid colon during pregnancy. She has subsequently had a successful vaginal delivery. Colonoscopy showed only mild diversion proctitis. She presents for colostomy takedown. She has undergone a mechanical and antibiotic bowel prep preoperatively. The procedure and recovery and potential risks have been discussed in detail documented elsewhere.    Procedure Detail:  Patient was brought to the operating room, placed in the supine position on the operating table, and general endotracheal anesthesia induced. She was carefully positioned in a semi-lithotomy and Foley catheter placed. Arms tucked. The abdomen was widely sterilely prepped and draped. She received preoperative broad-spectrum IV antibiotics. Patient timeout was performed and correct procedure verified. The colostomy had been isolated with a small sterile dressing and Tegaderm. Access was obtained through her previous upper midline 1 cm incision with an open Hassan technique and pneumoperitoneum established. There were noted to be essentially no adhesions within the abdomen. 5 mm trochars were placed on either side of the abdomen laterally at the level of the umbilicus and a 12 mm trocar placed in the right lower quadrant. The patient was placed in reverse Trendelenburg and the small bowel easily displaced out of the pelvis. There was noted to be a fairly long distal segment present which was fairly diminutive but otherwise appeared entirely normal. This was already quite mobile and fairly long. We felt at  this point that trying to do a transanal stapled anastomosis would not be possible unless we resected a lot of her distal colon down to the rectum. The sigmoid was mobilized distally dividing lateral peritoneal attachments. The ureter was clearly identified and protected. Following minimal mobilization the distal divided and could be easily brought up to the ostomy site even with the abdomen insufflated and at this point we felt the best approach would be to perform a handsewn anastomosis externally through the previous ostomy site. Following this the possible colon was examined and appeared normal. Some mobilization of the left colon was performed dividing peritoneal attachments. At this point the abdomen was desufflated. The ostomy dressing was removed and an elliptical incision was made transversely around the ostomy and dissected carried and the subcutaneous tissue. The dissection was carried along the bowel down to the level of the fascia. The bowel was dissected away from the fascia and peritoneum sharply and with cautery until the ostomy was completely freed from the abdominal wall. At this point the proximal bowel could be brought out with no tension. The mesentery was traced distally and the distal end of the sigmoid also came up through the ostomy site after enlarging the fascial defect slightly under no tension for anastomosis. Points of the proximal and distal bowel were chosen for resection and anastomosis and the mesentery and the mesentery of each segment divided with the Harmonic scalpel. The bowel was then divided between [facility] and Allen clamps and the colostomy and a short portion of the distal rectosigmoid resected. Again there was no tension and excellent blood supply. Following this a end and handsewn single-layer anastomosis with inverting 2-0 silk sutures was performed. The anastomosis was palpably widely patent. No tension. Good blood supply. The bowel was returned to the abdominal cavity. At  this  point all instruments and trochars were removed and a new clean set brought up and the patient redraped and gowns and gloves changed. We had looked back with the laparoscope prior to this and the anastomosis was easily visible under no tension, no twisting and it was no evidence of bleeding or trocar injury or other problems. At the start of the case a bilateral T AP block had been performed with Exparel diluted to 60 mL. 20 mL had been left in the ostomy site incision was infiltrated with this. Posterior peritoneum was closed with running 2-0 PDS and the anterior fascial layer closed with running 0 PDS begun at either end of the incision and tied centrally. The mattress suture at the epigastric trocar site had been secured and I also placed a figure-of-eight 0 Vicryl suture and the anterior fascia at the right lower quadrant trocar site. Trocar sites are closed with subcuticular Monocryl and the ostomy site closed transversely with staples. Sponge needle and counts were correct.    Findings: As above  Estimated Blood Loss:  less than 50 mL         Drains: None  Blood Given: none          Specimens: Ostomy and portion of sigmoid        Complications:  * No complications entered in OR log *         Disposition: PACU - hemodynamically stable.         Condition: stable

## 2017-03-19 NOTE — Interval H&P Note (Signed)
History and Physical Interval Note:  03/19/2017 9:09 AM  Lauren Zhang  has presented today for surgery, with the diagnosis of status post harmann colostomy  The various methods of treatment have been discussed with the patient and family. After consideration of risks, benefits and other options for treatment, the patient has consented to  Procedure(s): LAPAROSCOPIC TAKEDOWN HARTMANNS COLOSTOMY (N/A) as a surgical intervention .  The patient's history has been reviewed, patient examined, no change in status, stable for surgery.  I have reviewed the patient's chart and labs.  Questions were answered to the patient's satisfaction.     Rafe Mackowski T

## 2017-03-19 NOTE — Anesthesia Postprocedure Evaluation (Signed)
Anesthesia Post Note  Patient: Lauren Zhang  Procedure(s) Performed: Procedure(s) (LRB): LAPAROSCOPIC TAKEDOWN HARTMANNS COLOSTOMY (N/A)     Patient location during evaluation: PACU Anesthesia Type: General Level of consciousness: awake and sedated Pain management: pain level controlled Vital Signs Assessment: post-procedure vital signs reviewed and stable Respiratory status: spontaneous breathing Cardiovascular status: stable Postop Assessment: no signs of nausea or vomiting Anesthetic complications: no    Last Vitals:  Vitals:   03/19/17 1322 03/19/17 1330  BP:  119/83  Pulse: 81 86  Resp: 13 12  Temp:      Last Pain:  Vitals:   03/19/17 1330  TempSrc:   PainSc: Asleep   Pain Goal: Patients Stated Pain Goal: 3 (03/19/17 0804)               Breyana Follansbee JR,JOHN Susann GivensFRANKLIN

## 2017-03-19 NOTE — Anesthesia Preprocedure Evaluation (Addendum)
Anesthesia Evaluation  Patient identified by MRN, date of birth, ID band Patient awake    Reviewed: Allergy & Precautions, NPO status , Patient's Chart, lab work & pertinent test results  Airway Mallampati: I       Dental no notable dental hx. (+) Teeth Intact, Dental Advisory Given   Pulmonary    Pulmonary exam normal breath sounds clear to auscultation       Cardiovascular negative cardio ROS Normal cardiovascular exam Rhythm:Regular Rate:Normal     Neuro/Psych negative neurological ROS  negative psych ROS   GI/Hepatic negative GI ROS, Neg liver ROS,   Endo/Other  negative endocrine ROS  Renal/GU negative Renal ROS  negative genitourinary   Musculoskeletal negative musculoskeletal ROS (+)   Abdominal Normal abdominal exam  (+)   Peds  Hematology negative hematology ROS (+)   Anesthesia Other Findings   Reproductive/Obstetrics negative OB ROS                             Anesthesia Physical Anesthesia Plan  ASA: I  Anesthesia Plan: General   Post-op Pain Management:    Induction: Intravenous  PONV Risk Score and Plan: 4 or greater and Ondansetron, Dexamethasone, Propofol, Midazolam and Scopolamine patch - Pre-op  Airway Management Planned: Oral ETT  Additional Equipment:   Intra-op Plan:   Post-operative Plan: Extubation in OR  Informed Consent: I have reviewed the patients History and Physical, chart, labs and discussed the procedure including the risks, benefits and alternatives for the proposed anesthesia with the patient or authorized representative who has indicated his/her understanding and acceptance.   Dental advisory given  Plan Discussed with: CRNA and Surgeon  Anesthesia Plan Comments:         Anesthesia Quick Evaluation

## 2017-03-19 NOTE — Care Management Note (Signed)
Case Management Note  Patient Details  Name: Lauren Zhang MRN: [id number] Date of Birth: 09/23/1985  Subjective/Objective:    Laparoscopic takedown Hartmann's Colostomy                Action/Plan: Discharge Planning: NCM spoke to pt and states she lives at home with husband who can assist as needed. Pt states she does not have a PCP. Provided pt with list of [employer] providers. She can always go to website to search for PCP. No NCM needs identified. Will continue to follow.    Expected Discharge Date:                Expected Discharge Plan:  Home/Self Care  In-House Referral:  NA  Discharge planning Services  CM Consult  Post Acute Care Choice:  NA Choice offered to:  NA  DME Arranged:  N/A DME Agency:  NA  HH Arranged:  NA HH Agency:  NA  Status of Service:  Completed, signed off  If discussed at Long Length of Stay Meetings, dates discussed:    Additional Comments:  Elliot CousinShavis, Miamor Ayler Ellen, RN 03/19/2017, 2:51 PM

## 2017-03-19 NOTE — Transfer of Care (Signed)
Immediate Anesthesia Transfer of Care Note  Patient: Lauren Zhang  Procedure(s) Performed: Procedure(s): LAPAROSCOPIC TAKEDOWN HARTMANNS COLOSTOMY (N/A)  Patient Location: PACU  Anesthesia Type:General  Level of Consciousness: awake, alert , oriented and patient cooperative  Airway & Oxygen Therapy: Patient Spontanous Breathing and Patient connected to face mask oxygen  Post-op Assessment: Report given to RN, Post -op Vital signs reviewed and stable and Patient moving all extremities  Post vital signs: Reviewed and stable  Last Vitals:  Vitals:   03/19/17 0741  BP: 108/76  Pulse: 93  Resp: 16  Temp: 36.8 C    Last Pain:  Vitals:   03/19/17 0741  TempSrc: Oral      Patients Stated Pain Goal: 3 (03/19/17 0804)  Complications: No apparent anesthesia complications

## 2017-03-19 NOTE — H&P (Signed)
History of Present Illness Lauren Salina T. Lauren Eland MD; 03/12/2017 1:45 PM) The patient is a 31 year old female presenting for a post-operative visit. She returns for follow-up after emergency laparoscopic Hartmann colectomy on September 18, 2016 for perforation of her mid sigmoid colon. This happened during pregnancy at about [redacted] weeks gestation. She fortunately had an unremarkable recovery and no difficulty with her pregnancy. She had a normal vaginal delivery and the baby is doing well. She now returns for preoperative visit prior to planned takedown of her [facility] colostomy. She is feeling well. No abdominal or GI complaints. She had a colonoscopy by Dr. Dulce Sellar with verbal report of normal except for mild diversion proctitis and I will review this.   Problem List/Past Medical Lauren Salina T. Lauren Knoblock, MD; 03/12/2017 1:45 PM) LEFT GROIN PAIN (R10.32)  POSTOP CHECK (A21)   Past Surgical History Lauren Salina T. Lauren Lant, MD; 03/12/2017 1:45 PM) Appendectomy   Diagnostic Studies History Lauren Salina T. Lauren Neth, MD; 03/12/2017 1:45 PM) Colonoscopy  never Mammogram  never  Allergies Lauren Salina T. Lauren Mould, MD; 03/12/2017 1:45 PM) Allergies Reconciled  No Known Drug Allergies 02/13/2016  Medication History Lauren Salina T. Lauren Mantz, MD; 03/12/2017 1:45 PM) Medications Reconciled Prenatal Formula (Oral) Active.  Social History Lauren Salina T. Lauren Alcott, MD; 03/12/2017 1:45 PM) Alcohol use  Occasional alcohol use. Caffeine use  Coffee. No drug use  Tobacco use  Never smoker.  Family History Lauren Salina T. Lauren Raczynski, MD; 03/12/2017 1:45 PM) Arthritis  Father. Colon Polyps  Mother.  Pregnancy / Birth History Lauren Salina T. Lauren Shein, MD; 03/12/2017 1:45 PM) Age at menarche  12 years. Gravida  1 Maternal age  28-30 Para  0 Regular periods   Vitals (Lauren Zhang CMA; 03/12/2017 1:29 PM) 03/12/2017 1:28 PM Weight: 130 lb Height: 67in Body Surface Area: 1.68 m Body Mass Index: 20.36  kg/m  Temp.: 63F  Pulse: 90 (Regular)  BP: 100/60 (Sitting, Left Arm, Standard)       Physical Exam Lauren Salina T. Lauren Labarre MD; 03/12/2017 1:46 PM) The physical exam findings are as follows: Note:General: Healthy-appearing, in no distress Skin: Warm and dry without rash or infection. HEENT: No palpable masses or thyromegaly. Sclera nonicteric. Lungs: Breath sounds clear and equal. No wheezing or increased work of breathing. Cardiovascular: Regular rate and rhythm without murmer. No JVD or edema. Abdomen: Nondistended. Soft and nontender. Ostomy present left lower quadrant. No masses palpable. No organomegaly. No palpable hernias. Extremities: No edema or joint swelling or deformity. No chronic venous stasis changes. Neurologic: Alert and fully oriented. Gait normal. No focal weakness. Psychiatric: Normal mood and affect. Thought content appropriate with normal judgement and insight    Assessment & Plan Lauren Salina T. Lauren Baney MD; 03/12/2017 1:48 PM) COLOSTOMY STATUS (Z93.3) Impression: Status post Hartmann colectomy for sigmoid perforation during pregnancy. Etiology unclear, possible localized area of diverticulitis versus perforation from an enema she had received the night before. Recent colonoscopy unremarkable except for expected mild diversion proctitis. Rate proceed with colostomy takedown. We plan a laparoscopic approach. We discussed the nature of the procedure and expected recovery. We discussed possible need for open procedure. Risks of anesthetic complications, bleeding, infection, leakage or possible need to create another ostomy were discussed and understood. All her questions were answered. She is given a mechanical and antibiotic bowel prep. Current Plans Pt Education - [facility] Colon Bowel Prep 2015 Miralax/Antibiotics Started Neomycin Sulfate 500MG , 2 (two) Tablet SEE NOTE, #6, 03/12/2017, No Refill. Local Order: TAKE TWO TABLETS AT 2 PM, 3 PM, AND 10 PM THE DAY PRIOR TO  SURGERY  Started Flagyl 500MG , 2 (two) Tablet SEE NOTE, #6, 03/12/2017, No Refill. Local Order: Take at 2pm, 3pm, and 10pm the day prior to your colon operation

## 2017-03-20 ENCOUNTER — Encounter (HOSPITAL_COMMUNITY): Payer: Self-pay | Admitting: General Surgery

## 2017-03-20 LAB — BASIC METABOLIC PANEL
ANION GAP: 8 (ref 5–15)
BUN: 10 mg/dL (ref 6–20)
CO2: 29 mmol/L (ref 22–32)
Calcium: 9 mg/dL (ref 8.9–10.3)
Chloride: 104 mmol/L (ref 101–111)
Creatinine, Ser: 0.83 mg/dL (ref 0.44–1.00)
GLUCOSE: 84 mg/dL (ref 65–99)
POTASSIUM: 4.2 mmol/L (ref 3.5–5.1)
Sodium: 141 mmol/L (ref 135–145)

## 2017-03-20 LAB — CBC
HEMATOCRIT: 29.1 % — AB (ref 36.0–46.0)
Hemoglobin: 10 g/dL — ABNORMAL LOW (ref 12.0–15.0)
MCH: 30.4 pg (ref 26.0–34.0)
MCHC: 34.4 g/dL (ref 30.0–36.0)
MCV: 88.4 fL (ref 78.0–100.0)
PLATELETS: 223 10*3/uL (ref 150–400)
RBC: 3.29 MIL/uL — AB (ref 3.87–5.11)
RDW: 12.7 % (ref 11.5–15.5)
WBC: 8.7 10*3/uL (ref 4.0–10.5)

## 2017-03-20 MED ORDER — DIPHENHYDRAMINE HCL 25 MG PO CAPS
25.0000 mg | ORAL_CAPSULE | Freq: Four times a day (QID) | ORAL | Status: DC | PRN
  Administered 2017-03-20: 25 mg via ORAL
  Filled 2017-03-20: qty 1

## 2017-03-20 NOTE — Progress Notes (Signed)
Patient ID: Lauren Zhang, female   DOB: Apr 09, 1986, 31 y.o.   MRN: [id number] [facility] Surgery Progress Note:   1 Day Post-Op  Subjective: Mental status is clear.  Up and taking clears Objective: Vital signs in last 24 hours: Temp:  [97.6 F (36.4 C)-98.7 F (37.1 C)] 98.6 F (37 C) (06/30 0500) Pulse Rate:  [62-89] 72 (06/30 0500) Resp:  [9-18] 16 (06/30 0500) BP: (106-132)/(59-86) 119/68 (06/30 0500) SpO2:  [96 %-100 %] 98 % (06/30 0500)  Intake/Output from previous day: 06/29 0701 - 06/30 0700 In: 4711.7 [P.O.:700; I.V.:3961.7; IV Piggyback:50] Out: 2120 [Urine:2095; Blood:25] Intake/Output this shift: No intake/output data recorded.  Physical Exam: Work of breathing is normal.  Minimal pain.  Passing flatus and some liquid stool  Lab Results:  Results for orders placed or performed during the hospital encounter of 03/19/17 (from the past 48 hour(s))  CBC     Status: Abnormal   Collection Time: 03/20/17  5:32 AM  Result Value Ref Range   WBC 8.7 4.0 - 10.5 K/uL   RBC 3.29 (L) 3.87 - 5.11 MIL/uL   Hemoglobin 10.0 (L) 12.0 - 15.0 g/dL   HCT 29.1 (L) 36.0 - 46.0 %   MCV 88.4 78.0 - 100.0 fL   MCH 30.4 26.0 - 34.0 pg   MCHC 34.4 30.0 - 36.0 g/dL   RDW 12.7 11.5 - 15.5 %   Platelets 223 150 - 400 K/uL  Basic metabolic panel     Status: None   Collection Time: 03/20/17  5:32 AM  Result Value Ref Range   Sodium 141 135 - 145 mmol/L   Potassium 4.2 3.5 - 5.1 mmol/L   Chloride 104 101 - 111 mmol/L   CO2 29 22 - 32 mmol/L   Glucose, Bld 84 65 - 99 mg/dL   BUN 10 6 - 20 mg/dL   Creatinine, Ser 0.83 0.44 - 1.00 mg/dL   Calcium 9.0 8.9 - 10.3 mg/dL   GFR calc non Af Amer >60 >60 mL/min   GFR calc Af Amer >60 >60 mL/min    Comment: (NOTE) The eGFR has been calculated using the CKD EPI equation. This calculation has not been validated in all clinical situations. eGFR's persistently <60 mL/min signify possible Chronic Kidney Disease.    Anion gap 8 5 - 15     Radiology/Results: No results found.  Anti-infectives: Anti-infectives    Start     Dose/Rate Route Frequency Ordered Stop   03/19/17 0830  neomycin (MYCIFRADIN) tablet 1,000 mg  Status:  Discontinued     1,000 mg Oral 3 times per day 03/19/17 0830 03/19/17 1356   03/19/17 0830  metroNIDAZOLE (FLAGYL) tablet 1,000 mg  Status:  Discontinued     1,000 mg Oral 3 times per day 03/19/17 0830 03/19/17 1356   03/19/17 0800  cefoTEtan (CEFOTAN) 2 g in dextrose 5 % 50 mL IVPB     2 g 100 mL/hr over 30 Minutes Intravenous On call to O.R. 03/19/17 0741 03/19/17 0957      Assessment/Plan: Problem List: Patient Active Problem List   Diagnosis Date Noted  . Colostomy status ([place]) 03/19/2017  . Postpartum care following vaginal delivery 4/2 12/22/2016  . Second-degree perineal laceration, with delivery 12/22/2016  . Intra-abdominal free air of unknown etiology 09/18/2016    Will advance diet 1 Day Post-Op    LOS: 1 day   Matt B. Hassell Done, MD, [facility] [phone] beeper [phone]  03/20/2017 10:35 AM

## 2017-03-21 IMAGING — MR MR ABDOMEN W/O CM
5 of 7 series · 19 of 48 positions shown · non-contrast
Comparison: Abdominal radiograph 09/17/2016

CLINICAL DATA: Sudden onset abdominal pain. Free air seen on
radiograph.

EXAM:
MRI ABDOMEN WITHOUT CONTRAST
TECHNIQUE: Multiplanar multisequence MR imaging was performed without the
administration of intravenous contrast.

[Series 5: MRCP · coronal · 2.0mm · 0.78mm/px · 7 of 79 slices shown]
[im 1/79]
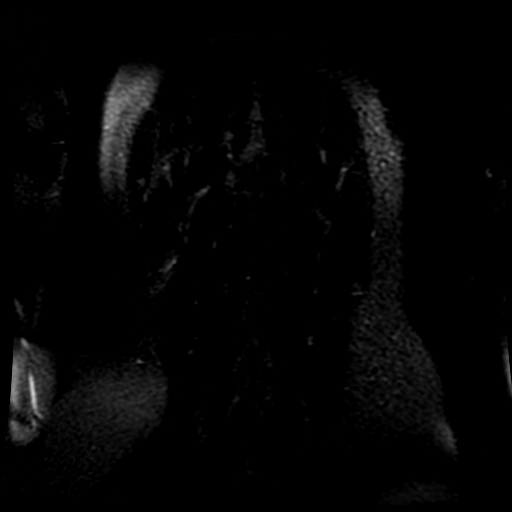
[im 14/79]
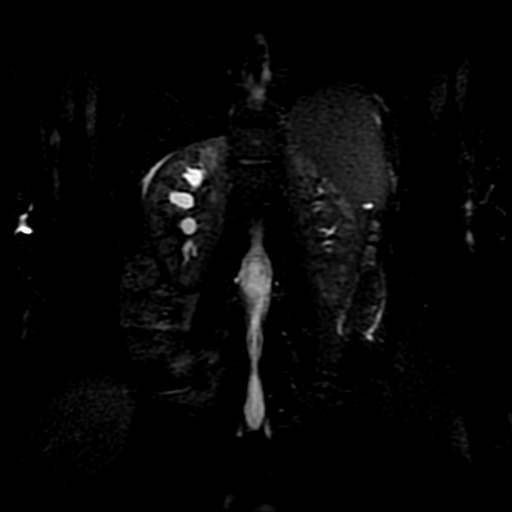
[im 27/79]
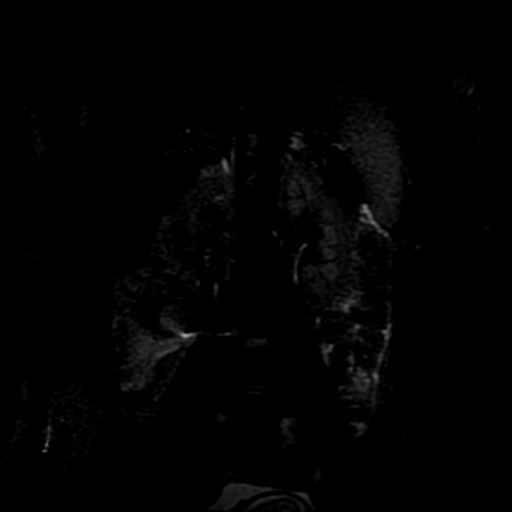
[im 40/79]
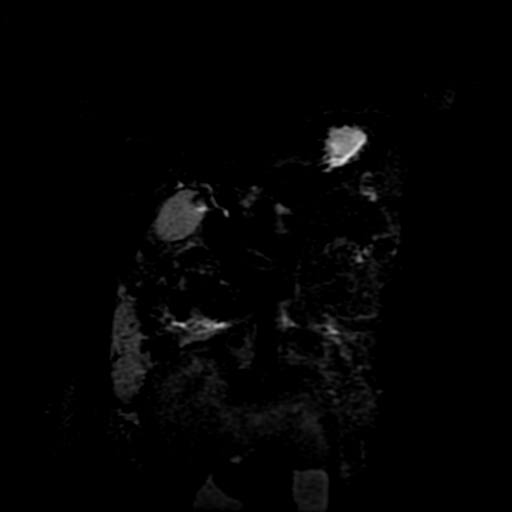
[im 53/79]
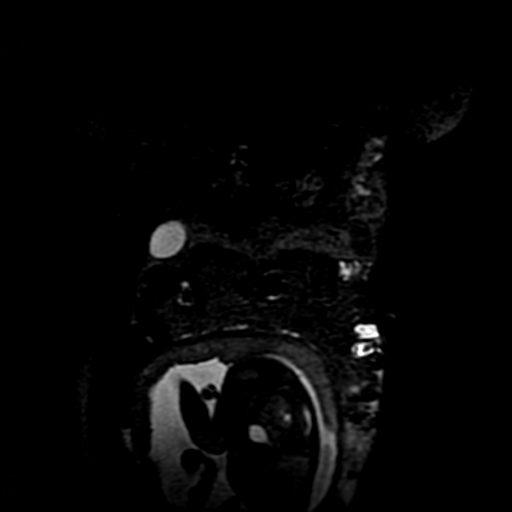
[im 66/79]
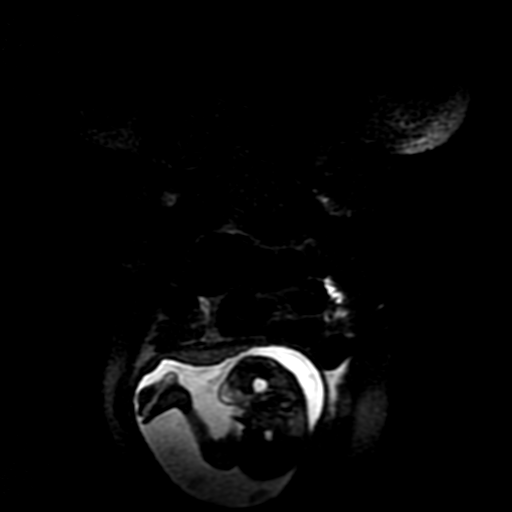
[im 79/79]
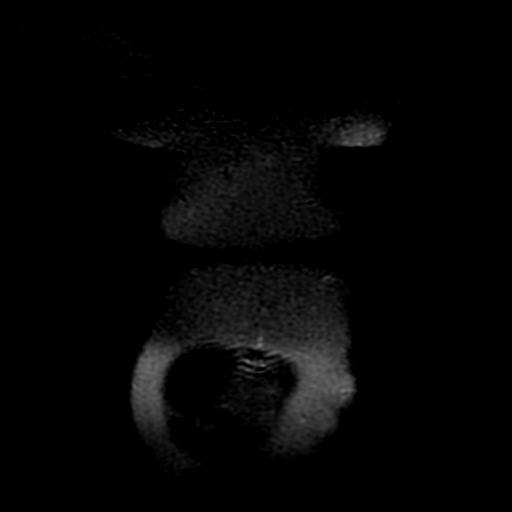

[Series 7: bSSFP fat-sat · coronal · 4.0mm · 0.78mm/px · 3 of 40 slices shown]
[im 1/40]
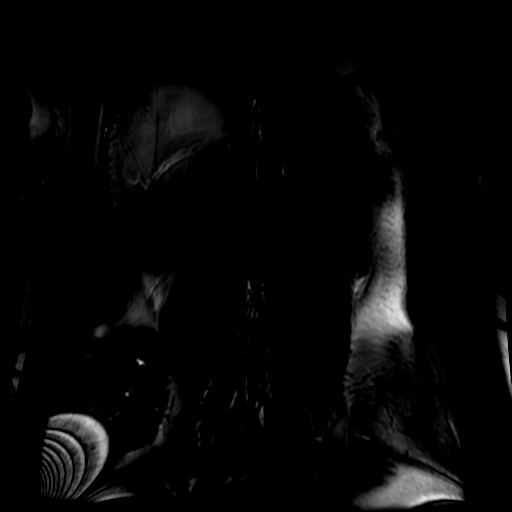
[im 20/40]
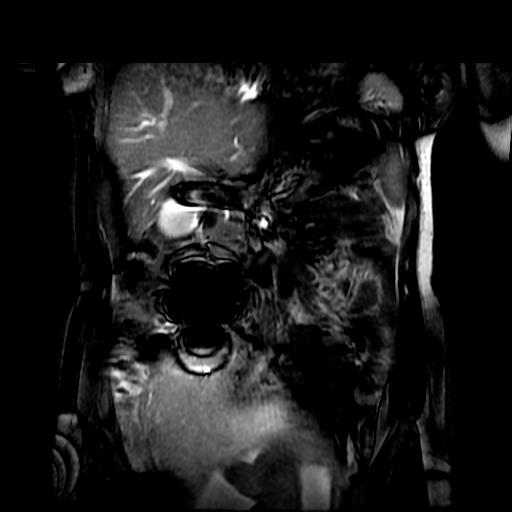
[im 40/40]
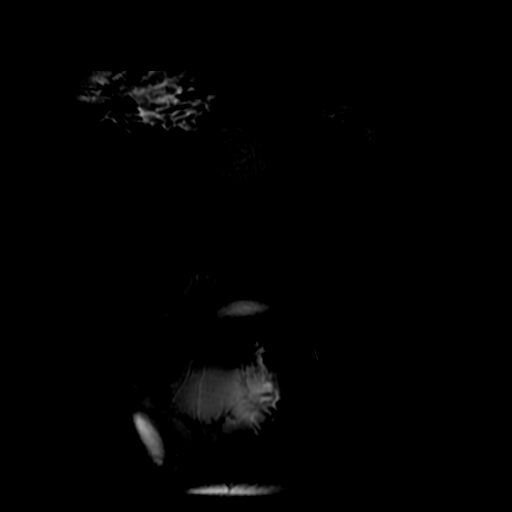

[Series 8: T2 fat-sat · axial · 5.0mm · 0.78mm/px · z∈[-89,+271]mm · 6 of 73 slices shown]
[im 1/73]
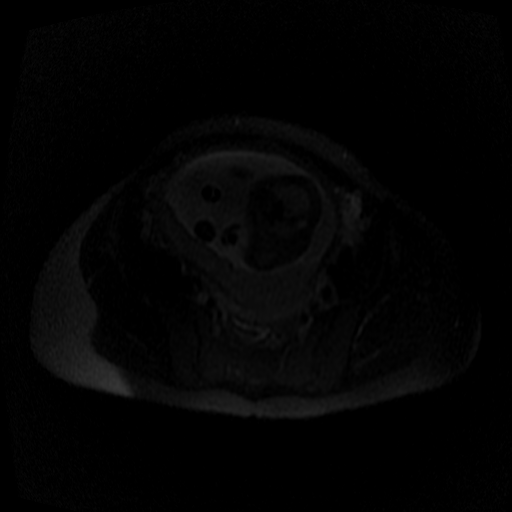
[im 15/73]
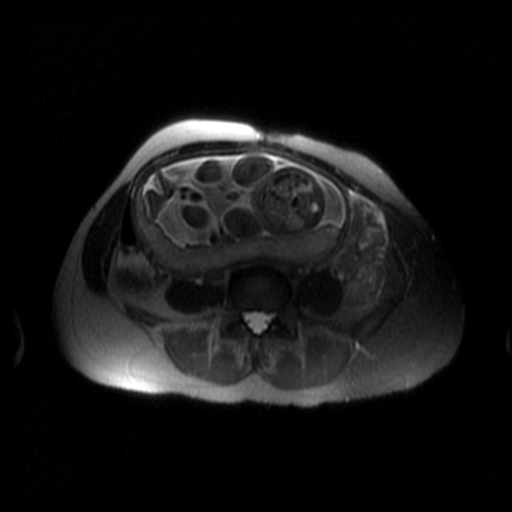
[im 29/73]
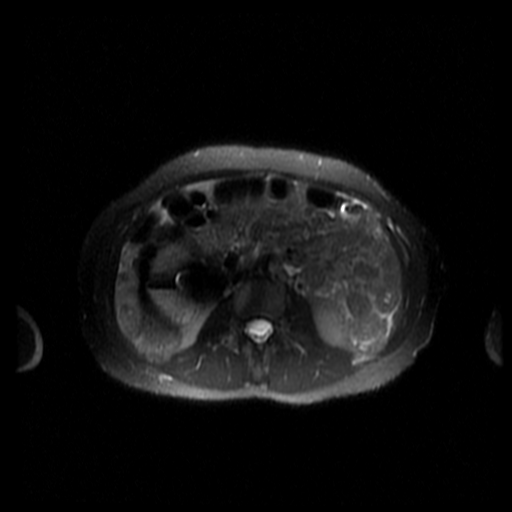
[im 44/73]
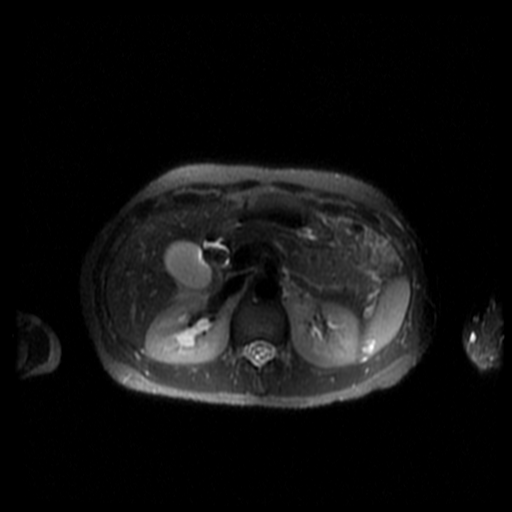
[im 58/73]
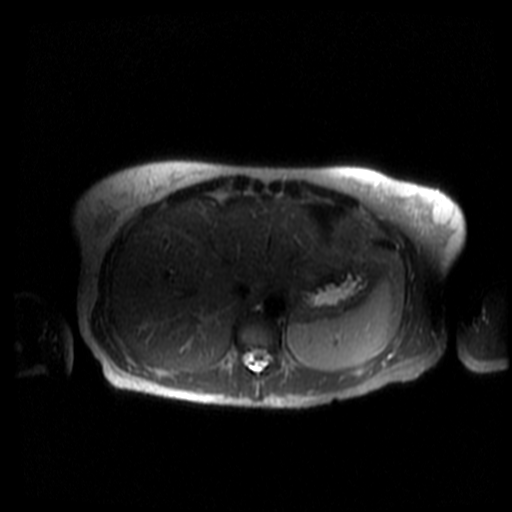
[im 73/73]
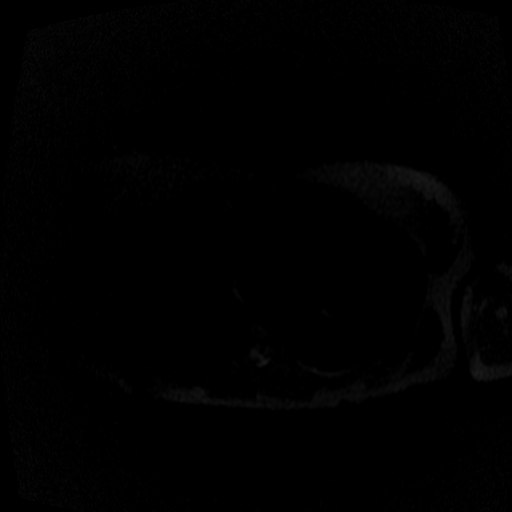

[Series 9: T2 · coronal · 10.0mm · 0.86mm/px · 2 of 23 slices shown]
[im 1/23]
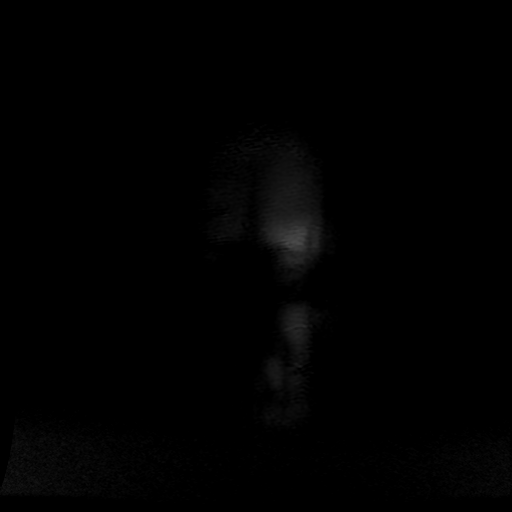
[im 23/23]
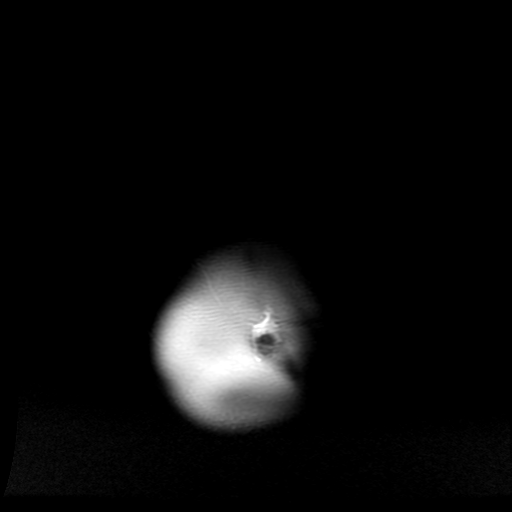

[Series 10: bSSFP · axial · 6.0mm · 0.86mm/px · 1 of 42 slices shown]
[im 1/42]
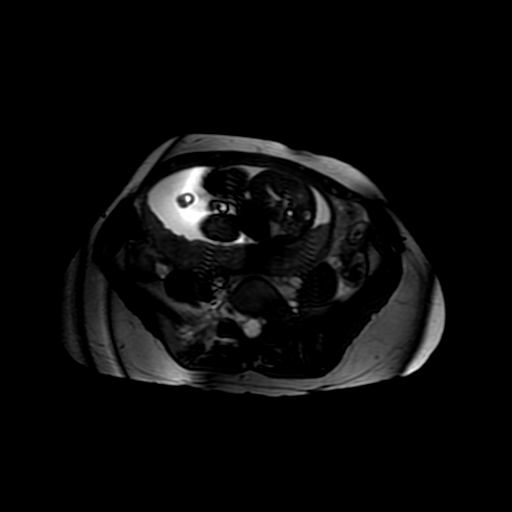

[19 of 48 positions shown; findings below may reference images not displayed]

FINDINGS: Lower chest: No abnormality identified.

Hepatobiliary: No mass or other parenchymal abnormality identified.
No biliary dilatation.

Pancreas: Pancreatic contours are normal. No peripancreatic fluid
collection. No pancreatic ductal dilatation.

Spleen:  Normal.

Adrenals/Urinary Tract: The right adrenal gland is normal. The left
adrenal gland is not clearly seen. There is moderate right
hydronephrosis. The left kidney is normal.

Stomach/Bowel: There is no dilated bowel. No focal bowel wall
thickening. There is trace fluid within the left pericolic gutter
but no well-defined fluid collection. MRI is not sensitive for the
detection of small volume free air.

Vascular/Lymphatic: The course and caliber of the major vessels of
the abdomen are normal. Expected flow voids are maintained. No
abdominal lymphadenopathy.

Other: There is a singleton intrauterine pregnancy. This study is
not optimized for evaluation of the fetus.

Musculoskeletal: No focal marrow lesion.
IMPRESSION: 1. No dilated small bowel or fluid collection within the abdomen.
MRI is insensitive for the detection of small volume free air. Given
the free air seen on the comparison radiograph, CT of the abdomen
and pelvis may be warranted to evaluate for possible bowel
perforation. At 26 weeks gestational age, the fetus is beyond the
most radiation sensitive period.
2. Moderate right hydronephrosis of pregnancy.

The findings were discussed with Dr. Legna by Dr. Vernell at [DATE]
on 09/18/2016.

## 2017-03-21 MED ORDER — ACETAMINOPHEN 500 MG PO TABS
1000.0000 mg | ORAL_TABLET | Freq: Four times a day (QID) | ORAL | Status: DC | PRN
  Administered 2017-03-21 (×2): 1000 mg via ORAL
  Filled 2017-03-21 (×2): qty 2

## 2017-03-21 NOTE — Progress Notes (Signed)
New order received via phone from Dr. Daphine DeutscherMartin for extra strength Tylenol for pt PRN per pt request.

## 2017-03-21 NOTE — Progress Notes (Signed)
Patient ID: Lauren Zhang, female   DOB: 05/01/1986, 31 y.o.   MRN: [id number] [facility] Progress Note:   2 Days Post-Op  Subjective: Mental status is clear. Up walking a lot with her husband.  Her child (born in April) has been coming up for breast feeding.   Objective: Vital signs in last 24 hours: Temp:  [98.1 F (36.7 C)-98.3 F (36.8 C)] 98.3 F (36.8 C) (07/01 0520) Pulse Rate:  [58-72] 72 (07/01 0520) Resp:  [12-13] 12 (07/01 0520) BP: (109-113)/(70-73) 109/73 (07/01 0520) SpO2:  [97 %-98 %] 97 % (07/01 0520)  Intake/Output from previous day: 06/30 0701 - 07/01 0700 In: 4980 [P.O.:2580; I.V.:2400] Out: -  Intake/Output this shift: No intake/output data recorded.  Physical Exam: Work of breathing is normal.  Advancing her diet slowly.    Lab Results:  Results for orders placed or performed during the hospital encounter of 03/19/17 (from the past 48 hour(s))  CBC     Status: Abnormal   Collection Time: 03/20/17  5:32 AM  Result Value Ref Range   WBC 8.7 4.0 - 10.5 K/uL   RBC 3.29 (L) 3.87 - 5.11 MIL/uL   Hemoglobin 10.0 (L) 12.0 - 15.0 g/dL   HCT 29.1 (L) 36.0 - 46.0 %   MCV 88.4 78.0 - 100.0 fL   MCH 30.4 26.0 - 34.0 pg   MCHC 34.4 30.0 - 36.0 g/dL   RDW 12.7 11.5 - 15.5 %   Platelets 223 150 - 400 K/uL  Basic metabolic panel     Status: None   Collection Time: 03/20/17  5:32 AM  Result Value Ref Range   Sodium 141 135 - 145 mmol/L   Potassium 4.2 3.5 - 5.1 mmol/L   Chloride 104 101 - 111 mmol/L   CO2 29 22 - 32 mmol/L   Glucose, Bld 84 65 - 99 mg/dL   BUN 10 6 - 20 mg/dL   Creatinine, Ser 0.83 0.44 - 1.00 mg/dL   Calcium 9.0 8.9 - 10.3 mg/dL   GFR calc non Af Amer >60 >60 mL/min   GFR calc Af Amer >60 >60 mL/min    Comment: (NOTE) The eGFR has been calculated using the CKD EPI equation. This calculation has not been validated in all clinical situations. eGFR's persistently <60 mL/min signify possible Chronic Kidney Disease.    Anion gap 8  5 - 15    Radiology/Results: No results found.  Anti-infectives: Anti-infectives    Start     Dose/Rate Route Frequency Ordered Stop   03/19/17 0830  neomycin (MYCIFRADIN) tablet 1,000 mg  Status:  Discontinued     1,000 mg Oral 3 times per day 03/19/17 0830 03/19/17 1356   03/19/17 0830  metroNIDAZOLE (FLAGYL) tablet 1,000 mg  Status:  Discontinued     1,000 mg Oral 3 times per day 03/19/17 0830 03/19/17 1356   03/19/17 0800  cefoTEtan (CEFOTAN) 2 g in dextrose 5 % 50 mL IVPB     2 g 100 mL/hr over 30 Minutes Intravenous On call to O.R. 03/19/17 0741 03/19/17 0957      Assessment/Plan: Problem List: Patient Active Problem List   Diagnosis Date Noted  . Colostomy status (HCC) 03/19/2017  . Postpartum care following vaginal delivery 4/2 12/22/2016  . Second-degree perineal laceration, with delivery 12/22/2016  . Intra-abdominal free air of unknown etiology 09/18/2016    Doing well.  Hopeful advancement of diet tomorrow.   2 Days Post-Op    LOS: 2 days     Matt B. Hassell Done, MD, [facility] [phone] beeper [phone]  03/21/2017 10:09 AM

## 2017-03-22 NOTE — Discharge Summary (Signed)
  Patient ID: Lauren Zhang [id number] 30 y.o. 05/21/1986  03/19/2017  Discharge date and time: 03/22/2017   Admitting Physician: Glenna FellowsHOXWORTH,Nicco Reaume T  Discharge Physician: Glenna FellowsHOXWORTH,Collier Monica T  Admission Diagnoses: status post harmann colostomy  Discharge Diagnoses: Same  Operations: Procedure(s): LAPAROSCOPIC TAKEDOWN HARTMANNS COLOSTOMY  Admission Condition: good  Discharged Condition: good  Indication for Admission: Patient is status post emergency laparoscopic Hartmann colectomy for perforation of the proximal sigmoid colon which occurred during a pregnancy 6 months ago. Etiology was never entirely clear. She did well and had a normal delivery of a healthy child. She has had a colonoscopy which is negative except for mild diversion proctitis. She is electively admitted for takedown of her colostomy.  Hospital Course: On the morning of admission the patient underwent an uneventful laparoscopic colostomy takedown. Her postoperative course was unremarkable. She was started on clear liquids on the first postoperative day and tolerated this. She had some expected discomfort and gas and bloating but by the second postoperative day began to have flatus and loose bowel movements. CBC was normal. Vital signs remained normal without fever. The time of discharge she has had several bowel movements. Tolerating a diet. Minimal discomfort controlled by Tylenol. Abdomen is nontender and incisions healing well.   Disposition: Home  Patient Instructions:  Allergies as of 03/22/2017   No Known Allergies     Medication List    TAKE these medications   acetaminophen 325 MG tablet Commonly known as:  TYLENOL Take 2 tablets (650 mg total) by mouth every 4 (four) hours as needed (for pain scale < 4).   ibuprofen 600 MG tablet Commonly known as:  ADVIL,MOTRIN Take 1 tablet (600 mg total) by mouth every 6 (six) hours.   norethindrone 0.35 MG tablet Commonly known as:  MICRONOR,CAMILA,ERRIN Take  1 tablet by mouth daily.   prenatal multivitamin Tabs tablet Take 1 tablet by mouth daily.       Activity: no heavy lifting for 4 weeks Diet: regular diet Wound Care: none needed  Follow-up:  With Dr. Johna SheriffHoxworth in 10 days.  Signed: Mariella SaaBenjamin T Aloys Hupfer MD, FACS  03/22/2017, 9:44 AM

## 2017-03-22 NOTE — Progress Notes (Signed)
Discharge instructions discussed with patient, verbalized agreement and understanding 

## 2017-03-22 NOTE — Discharge Instructions (Signed)
[facility] ______[facility] °LAPAROSCOPIC SURGERY: POST OP INSTRUCTIONS °Always review your discharge instruction sheet given to you by the facility where your surgery was performed. °IF YOU HAVE DISABILITY OR FAMILY LEAVE FORMS, YOU MUST BRING THEM TO THE OFFICE FOR PROCESSING.   °DO NOT GIVE THEM TO YOUR DOCTOR. ° °1. A prescription for pain medication may be given to you upon discharge.  Take your pain medication as prescribed, if needed.  If narcotic pain medicine is not needed, then you may take acetaminophen (Tylenol) or ibuprofen (Advil) as needed. °2. Take your usually prescribed medications unless otherwise directed. °3. If you need a refill on your pain medication, please contact your pharmacy.  They will contact our office to request authorization. Prescriptions will not be filled after 5pm or on week-ends. °4. You should follow a light diet the first few days after arrival home, such as soup and crackers, etc.  Be sure to include lots of fluids daily. °5. Most patients will experience some swelling and bruising in the area of the incisions.  Ice packs will help.  Swelling and bruising can take several days to resolve.  °6. It is common to experience some constipation if taking pain medication after surgery.  Increasing fluid intake and taking a stool softener (such as Colace) will usually help or prevent this problem from occurring.  A mild laxative (Milk of Magnesia or Miralax) should be taken according to package instructions if there are no bowel movements after 48 hours. °7. Unless discharge instructions indicate otherwise, you may remove your bandages 24-48 hours after surgery, and you may shower at that time.  You may have steri-strips (small skin tapes) in place directly over the incision.  These strips should be left on the skin for 7-10 days.  If your surgeon used skin glue on the incision, you may shower in 24 hours.  The glue will flake off over the next 2-3 weeks.  Any sutures or  staples will be removed at the office during your follow-up visit. °8. ACTIVITIES:  You may resume regular (light) daily activities beginning the next day--such as daily self-care, walking, climbing stairs--gradually increasing activities as tolerated.  You may have sexual intercourse when it is comfortable.  Refrain from any heavy lifting or straining until approved by your doctor. °a. You may drive when you are no longer taking prescription pain medication, you can comfortably wear a seatbelt, and you can safely maneuver your car and apply brakes. °b. RETURN TO WORK:  __________________________________________________________ °9. You should see your doctor in the office for a follow-up appointment approximately 2-3 weeks after your surgery.  Make sure that you call for this appointment within a day or two after you arrive home to insure a convenient appointment time. °10. OTHER INSTRUCTIONS: __________________________________________________________________________________________________________________________ __________________________________________________________________________________________________________________________ °WHEN TO CALL YOUR DOCTOR: °1. Fever over 101.0 °2. Inability to urinate °3. Continued bleeding from incision. °4. Increased pain, redness, or drainage from the incision. °5. Increasing abdominal pain ° °The clinic staff is available to answer your questions during regular business hours.  Please don’t hesitate to call and ask to speak to one of the nurses for clinical concerns.  If you have a medical emergency, go to the nearest emergency room or call 911.  A surgeon from [facility] is always on call at the hospital. °[address], [address], [place], [place]  [address] ? [address] [address], [place], [place]   [address] °[phone] ? [phone] ? FAX [phone] °Web site:   [email or web address] °

## 2017-03-22 NOTE — Progress Notes (Signed)
Patient ID: Lauren Zhang, female   DOB: 01/11/1986, 31 y.o.   MRN: [id number] 3 Days Post-Op   Subjective: Doing well this morning. Mild incisional pain controlled with Tylenol. Has begun to have flatus and loose bowel movements. Tolerating diet without nausea. Feels ready to go home.  Objective: Vital signs in last 24 hours: Temp:  [98.3 F (36.8 C)-98.5 F (36.9 C)] 98.3 F (36.8 C) (07/02 0539) Pulse Rate:  [63-75] 73 (07/02 0539) Resp:  [12-18] 18 (07/02 0539) BP: (112-120)/(63-76) 120/74 (07/02 0539) SpO2:  [98 %-99 %] 98 % (07/02 0539) Last BM Date: 03/21/17  Intake/Output from previous day: 07/01 0701 - 07/02 0700 In: 1554.2 [P.O.:120; I.V.:1434.2] Out: -  Intake/Output this shift: No intake/output data recorded.  General appearance: alert, cooperative and no distress GI: normal findings: soft, non-tender and Nondistended Incision/Wound: No erythema or drainage or swelling  Lab Results:   Recent Labs  03/20/17 0532  WBC 8.7  HGB 10.0*  HCT 29.1*  PLT 223   BMET  Recent Labs  03/20/17 0532  NA 141  K 4.2  CL 104  CO2 29  GLUCOSE 84  BUN 10  CREATININE 0.83  CALCIUM 9.0     Studies/Results: No results found.  Anti-infectives: Anti-infectives    Start     Dose/Rate Route Frequency Ordered Stop   03/19/17 0830  neomycin (MYCIFRADIN) tablet 1,000 mg  Status:  Discontinued     1,000 mg Oral 3 times per day 03/19/17 0830 03/19/17 1356   03/19/17 0830  metroNIDAZOLE (FLAGYL) tablet 1,000 mg  Status:  Discontinued     1,000 mg Oral 3 times per day 03/19/17 0830 03/19/17 1356   03/19/17 0800  cefoTEtan (CEFOTAN) 2 g in dextrose 5 % 50 mL IVPB     2 g 100 mL/hr over 30 Minutes Intravenous On call to O.R. 03/19/17 0741 03/19/17 0957      Assessment/Plan: s/p Procedure(s): LAPAROSCOPIC TAKEDOWN HARTMANNS COLOSTOMY Doing well without apparent complication. Okay for discharge today.   LOS: 3 days    Vivien Barretto T 03/22/2017

## 2017-03-31 ENCOUNTER — Ambulatory Visit
Admission: RE | Admit: 2017-03-31 | Discharge: 2017-03-31 | Disposition: A | Discharge status: Home / Self Care | Payer: BLUE CROSS/BLUE SHIELD | Source: Ambulatory Visit | Attending: General Surgery | Admitting: General Surgery

## 2017-03-31 ENCOUNTER — Other Ambulatory Visit: Payer: Self-pay | Admitting: General Surgery

## 2017-03-31 DIAGNOSIS — Z933 Colostomy status: Secondary | ICD-10-CM

## 2017-03-31 MED ORDER — IOPAMIDOL (ISOVUE-300) INJECTION 61%
100.0000 mL | Freq: Once | INTRAVENOUS | Status: AC | PRN
  Administered 2017-03-31: 100 mL via INTRAVENOUS

## 2019-06-27 LAB — OB RESULTS CONSOLE ABO/RH: RH Type: POSITIVE

## 2019-06-27 LAB — OB RESULTS CONSOLE RPR: RPR: NONREACTIVE

## 2019-06-27 LAB — OB RESULTS CONSOLE RUBELLA ANTIBODY, IGM: Rubella: IMMUNE

## 2019-06-27 LAB — OB RESULTS CONSOLE HIV ANTIBODY (ROUTINE TESTING): HIV: NONREACTIVE

## 2019-06-27 LAB — OB RESULTS CONSOLE ANTIBODY SCREEN: Antibody Screen: NEGATIVE

## 2019-06-27 LAB — OB RESULTS CONSOLE GC/CHLAMYDIA
Chlamydia: NEGATIVE
Gonorrhea: NEGATIVE

## 2019-06-27 LAB — OB RESULTS CONSOLE HEPATITIS B SURFACE ANTIGEN: Hepatitis B Surface Ag: NEGATIVE

## 2019-09-22 NOTE — L&D Delivery Note (Signed)
Delivery Note At 9:20 PM a viable and healthy female was delivered via Vaginal, Spontaneous (Presentation: Left Occiput Anterior).  APGAR: 9, 9; weight pending .   Placenta status: Spontaneous, Intact.  Cord: 3 vessels with the following complications: None.  Cord pH: na  Anesthesia: Epidural Episiotomy: None Lacerations: 1st degree Suture Repair: 2.0 vicryl rapide Est. Blood Loss (mL):  250  Mom to postpartum.  Baby to Couplet care / Skin to Skin.  Chakita Mcgraw J 01/14/2020, 9:47 PM

## 2020-01-11 ENCOUNTER — Encounter (HOSPITAL_COMMUNITY): Payer: Self-pay | Admitting: *Deleted

## 2020-01-11 ENCOUNTER — Telehealth (HOSPITAL_COMMUNITY): Payer: Self-pay | Admitting: *Deleted

## 2020-01-11 NOTE — Telephone Encounter (Signed)
Preadmission screen  

## 2020-01-12 ENCOUNTER — Other Ambulatory Visit: Payer: Self-pay | Admitting: Obstetrics and Gynecology

## 2020-01-13 ENCOUNTER — Other Ambulatory Visit (HOSPITAL_COMMUNITY)
Admission: RE | Admit: 2020-01-13 | Discharge: 2020-01-13 | Disposition: A | Discharge status: Home / Self Care | Payer: No Typology Code available for payment source | Source: Ambulatory Visit | Attending: Obstetrics and Gynecology | Admitting: Obstetrics and Gynecology

## 2020-01-13 LAB — SARS CORONAVIRUS 2 (TAT 6-24 HRS): SARS Coronavirus 2: NEGATIVE

## 2020-01-14 ENCOUNTER — Inpatient Hospital Stay (HOSPITAL_COMMUNITY)
Admission: AD | Admit: 2020-01-14 | Discharge: 2020-01-16 | DRG: 807 | Disposition: A | Discharge status: Home / Self Care | Payer: No Typology Code available for payment source | Attending: Obstetrics and Gynecology | Admitting: Obstetrics and Gynecology

## 2020-01-14 ENCOUNTER — Inpatient Hospital Stay (HOSPITAL_COMMUNITY): Payer: No Typology Code available for payment source | Admitting: Anesthesiology

## 2020-01-14 ENCOUNTER — Encounter (HOSPITAL_COMMUNITY): Payer: Self-pay | Admitting: Obstetrics and Gynecology

## 2020-01-14 ENCOUNTER — Other Ambulatory Visit: Payer: Self-pay

## 2020-01-14 DIAGNOSIS — O26893 Other specified pregnancy related conditions, third trimester: Secondary | ICD-10-CM | POA: Diagnosis present

## 2020-01-14 DIAGNOSIS — Z20822 Contact with and (suspected) exposure to covid-19: Secondary | ICD-10-CM | POA: Diagnosis present

## 2020-01-14 DIAGNOSIS — Z933 Colostomy status: Secondary | ICD-10-CM

## 2020-01-14 DIAGNOSIS — Z3A38 38 weeks gestation of pregnancy: Secondary | ICD-10-CM

## 2020-01-14 DIAGNOSIS — Z3490 Encounter for supervision of normal pregnancy, unspecified, unspecified trimester: Secondary | ICD-10-CM | POA: Diagnosis present

## 2020-01-14 DIAGNOSIS — O2243 Hemorrhoids in pregnancy, third trimester: Secondary | ICD-10-CM | POA: Diagnosis present

## 2020-01-14 HISTORY — DX: Other specified postprocedural states: Z98.890

## 2020-01-14 LAB — CBC
HCT: 35.3 % — ABNORMAL LOW (ref 36.0–46.0)
Hemoglobin: 11.6 g/dL — ABNORMAL LOW (ref 12.0–15.0)
MCH: 29.9 pg (ref 26.0–34.0)
MCHC: 32.9 g/dL (ref 30.0–36.0)
MCV: 91 fL (ref 80.0–100.0)
Platelets: 245 10*3/uL (ref 150–400)
RBC: 3.88 MIL/uL (ref 3.87–5.11)
RDW: 12.5 % (ref 11.5–15.5)
WBC: 10.4 10*3/uL (ref 4.0–10.5)
nRBC: 0 % (ref 0.0–0.2)

## 2020-01-14 LAB — TYPE AND SCREEN
ABO/RH(D): O POS
Antibody Screen: NEGATIVE

## 2020-01-14 MED ORDER — PHENYLEPHRINE 40 MCG/ML (10ML) SYRINGE FOR IV PUSH (FOR BLOOD PRESSURE SUPPORT): 80.0000 ug | PREFILLED_SYRINGE | INTRAVENOUS | Status: DC | PRN

## 2020-01-14 MED ORDER — LACTATED RINGERS IV SOLN
500.0000 mL | Freq: Once | INTRAVENOUS | Status: DC
Start: 2020-01-14 — End: 2020-01-14

## 2020-01-14 MED ORDER — OXYCODONE-ACETAMINOPHEN 5-325 MG PO TABS: 1.0000 | ORAL_TABLET | ORAL | Status: DC | PRN

## 2020-01-14 MED ORDER — SODIUM CHLORIDE 0.9 % IV SOLN
12.0000 mL/h | INTRAVENOUS | Status: DC
  Filled 2020-01-14: qty 25

## 2020-01-14 MED ORDER — PRENATAL MULTIVITAMIN CH
1.0000 | ORAL_TABLET | Freq: Every day | ORAL | Status: DC
  Filled 2020-01-14 (×2): qty 1

## 2020-01-14 MED ORDER — FENTANYL-BUPIVACAINE-NACL 0.5-0.125-0.9 MG/250ML-% EP SOLN: 12.0000 mL/h | EPIDURAL | Status: DC | PRN

## 2020-01-14 MED ORDER — OXYTOCIN 40 UNITS IN NORMAL SALINE INFUSION - SIMPLE MED
2.5000 [IU]/h | INTRAVENOUS | Status: DC
  Filled 2020-01-14: qty 1000

## 2020-01-14 MED ORDER — METHYLERGONOVINE MALEATE 0.2 MG PO TABS: 0.2000 mg | ORAL_TABLET | ORAL | Status: DC | PRN

## 2020-01-14 MED ORDER — DIBUCAINE (PERIANAL) 1 % EX OINT: 1.0000 "application " | TOPICAL_OINTMENT | CUTANEOUS | Status: DC | PRN

## 2020-01-14 MED ORDER — SIMETHICONE 80 MG PO CHEW: 80.0000 mg | CHEWABLE_TABLET | ORAL | Status: DC | PRN

## 2020-01-14 MED ORDER — FENTANYL-BUPIVACAINE-NACL 0.5-0.125-0.9 MG/250ML-% EP SOLN
EPIDURAL | Status: AC
  Filled 2020-01-14: qty 250

## 2020-01-14 MED ORDER — DIPHENHYDRAMINE HCL 50 MG/ML IJ SOLN
12.5000 mg | INTRAMUSCULAR | Status: DC | PRN
Start: 2020-01-14 — End: 2020-01-14

## 2020-01-14 MED ORDER — OXYTOCIN BOLUS FROM INFUSION
500.0000 mL | Freq: Once | INTRAVENOUS | Status: AC
  Administered 2020-01-14: 500 mL via INTRAVENOUS

## 2020-01-14 MED ORDER — DIPHENHYDRAMINE HCL 25 MG PO CAPS: 25.0000 mg | ORAL_CAPSULE | Freq: Four times a day (QID) | ORAL | Status: DC | PRN

## 2020-01-14 MED ORDER — ZOLPIDEM TARTRATE 5 MG PO TABS: 5.0000 mg | ORAL_TABLET | Freq: Every evening | ORAL | Status: DC | PRN

## 2020-01-14 MED ORDER — LIDOCAINE HCL (PF) 1 % IJ SOLN
INTRAMUSCULAR | Status: DC | PRN
  Administered 2020-01-14: 10 mL via EPIDURAL

## 2020-01-14 MED ORDER — EPHEDRINE 5 MG/ML INJ: 10.0000 mg | INTRAVENOUS | Status: DC | PRN

## 2020-01-14 MED ORDER — OXYTOCIN 40 UNITS IN NORMAL SALINE INFUSION - SIMPLE MED: 1.0000 m[IU]/min | INTRAVENOUS | Status: DC

## 2020-01-14 MED ORDER — LACTATED RINGERS IV SOLN: 500.0000 mL | INTRAVENOUS | Status: DC | PRN

## 2020-01-14 MED ORDER — LACTATED RINGERS IV SOLN
500.0000 mL | Freq: Once | INTRAVENOUS | Status: AC
  Administered 2020-01-14: 500 mL via INTRAVENOUS

## 2020-01-14 MED ORDER — TERBUTALINE SULFATE 1 MG/ML IJ SOLN: 0.2500 mg | Freq: Once | INTRAMUSCULAR | Status: DC | PRN

## 2020-01-14 MED ORDER — WITCH HAZEL-GLYCERIN EX PADS
1.0000 "application " | MEDICATED_PAD | CUTANEOUS | Status: DC | PRN
  Administered 2020-01-15: 1 via TOPICAL

## 2020-01-14 MED ORDER — PHENYLEPHRINE 40 MCG/ML (10ML) SYRINGE FOR IV PUSH (FOR BLOOD PRESSURE SUPPORT)
80.0000 ug | PREFILLED_SYRINGE | INTRAVENOUS | Status: DC | PRN
Start: 2020-01-14 — End: 2020-01-14

## 2020-01-14 MED ORDER — TETANUS-DIPHTH-ACELL PERTUSSIS 5-2.5-18.5 LF-MCG/0.5 IM SUSP: 0.5000 mL | Freq: Once | INTRAMUSCULAR | Status: DC

## 2020-01-14 MED ORDER — LIDOCAINE HCL (PF) 1 % IJ SOLN: 30.0000 mL | INTRAMUSCULAR | Status: DC | PRN

## 2020-01-14 MED ORDER — ACETAMINOPHEN 325 MG PO TABS
650.0000 mg | ORAL_TABLET | ORAL | Status: DC | PRN
  Administered 2020-01-14: 650 mg via ORAL
  Filled 2020-01-14: qty 2

## 2020-01-14 MED ORDER — PHENYLEPHRINE 40 MCG/ML (10ML) SYRINGE FOR IV PUSH (FOR BLOOD PRESSURE SUPPORT)
80.0000 ug | PREFILLED_SYRINGE | INTRAVENOUS | Status: DC | PRN
  Filled 2020-01-14: qty 10

## 2020-01-14 MED ORDER — LACTATED RINGERS IV SOLN: INTRAVENOUS | Status: DC

## 2020-01-14 MED ORDER — COCONUT OIL OIL: 1.0000 "application " | TOPICAL_OIL | Status: DC | PRN

## 2020-01-14 MED ORDER — SOD CITRATE-CITRIC ACID 500-334 MG/5ML PO SOLN: 30.0000 mL | ORAL | Status: DC | PRN

## 2020-01-14 MED ORDER — ONDANSETRON HCL 4 MG PO TABS: 4.0000 mg | ORAL_TABLET | ORAL | Status: DC | PRN

## 2020-01-14 MED ORDER — IBUPROFEN 600 MG PO TABS
600.0000 mg | ORAL_TABLET | Freq: Four times a day (QID) | ORAL | Status: DC
  Administered 2020-01-15 – 2020-01-16 (×6): 600 mg via ORAL
  Filled 2020-01-14 (×6): qty 1

## 2020-01-14 MED ORDER — BENZOCAINE-MENTHOL 20-0.5 % EX AERO
1.0000 "application " | INHALATION_SPRAY | CUTANEOUS | Status: DC | PRN
  Administered 2020-01-15: 1 via TOPICAL
  Filled 2020-01-14: qty 56

## 2020-01-14 MED ORDER — DIPHENHYDRAMINE HCL 50 MG/ML IJ SOLN: 12.5000 mg | INTRAMUSCULAR | Status: DC | PRN

## 2020-01-14 MED ORDER — SENNOSIDES-DOCUSATE SODIUM 8.6-50 MG PO TABS
2.0000 | ORAL_TABLET | ORAL | Status: DC
  Administered 2020-01-15: 2 via ORAL
  Filled 2020-01-14 (×2): qty 2

## 2020-01-14 MED ORDER — ACETAMINOPHEN 325 MG PO TABS: 650.0000 mg | ORAL_TABLET | ORAL | Status: DC | PRN

## 2020-01-14 MED ORDER — OXYCODONE-ACETAMINOPHEN 5-325 MG PO TABS: 2.0000 | ORAL_TABLET | ORAL | Status: DC | PRN

## 2020-01-14 MED ORDER — ONDANSETRON HCL 4 MG/2ML IJ SOLN: 4.0000 mg | INTRAMUSCULAR | Status: DC | PRN

## 2020-01-14 MED ORDER — EPHEDRINE 5 MG/ML INJ
10.0000 mg | INTRAVENOUS | Status: DC | PRN
Start: 2020-01-14 — End: 2020-01-14

## 2020-01-14 MED ORDER — ONDANSETRON HCL 4 MG/2ML IJ SOLN: 4.0000 mg | Freq: Four times a day (QID) | INTRAMUSCULAR | Status: DC | PRN

## 2020-01-14 MED ORDER — METHYLERGONOVINE MALEATE 0.2 MG/ML IJ SOLN: 0.2000 mg | INTRAMUSCULAR | Status: DC | PRN

## 2020-01-14 NOTE — Anesthesia Preprocedure Evaluation (Addendum)
Anesthesia Evaluation  Patient identified by MRN, date of birth, ID band Patient awake    Reviewed: Allergy & Precautions, H&P , NPO status , Patient's Chart, lab work & pertinent test results  History of Anesthesia Complications Negative for: history of anesthetic complications  Airway Mallampati: II  TM Distance: >3 FB Neck ROM: full    Dental no notable dental hx.    Pulmonary neg pulmonary ROS,    Pulmonary exam normal        Cardiovascular negative cardio ROS Normal cardiovascular exam Rhythm:regular Rate:Normal     Neuro/Psych negative neurological ROS  negative psych ROS   GI/Hepatic Neg liver ROS, Past h/o bowel perforation s/p colon resection, colostomy, subsequently reversed   Endo/Other  negative endocrine ROS  Renal/GU negative Renal ROS  negative genitourinary   Musculoskeletal   Abdominal   Peds  Hematology negative hematology ROS (+)   Anesthesia Other Findings   Reproductive/Obstetrics (+) Pregnancy                            Anesthesia Physical Anesthesia Plan  ASA: II  Anesthesia Plan: Epidural   Post-op Pain Management:    Induction:   PONV Risk Score and Plan:   Airway Management Planned:   Additional Equipment:   Intra-op Plan:   Post-operative Plan:   Informed Consent: I have reviewed the patients History and Physical, chart, labs and discussed the procedure including the risks, benefits and alternatives for the proposed anesthesia with the patient or authorized representative who has indicated his/her understanding and acceptance.       Plan Discussed with:   Anesthesia Plan Comments: ( Had severe itching with previous epidural. Will use non-narcotic infusion.)       Anesthesia Quick Evaluation

## 2020-01-14 NOTE — H&P (Signed)
Lauren Zhang is a 34 y.o. female presenting for labor. OB History    Gravida  3   Para  1   Term  1   Preterm      AB  1   Living  1     SAB  1   TAB      Ectopic      Multiple  0   Live Births  1          Past Medical History:  Diagnosis Date  . History of acute cystitis   . History of colostomy reversal   . Medical history non-contributory   . Perforation bowel (HCC)   . S/P partial resection of colon 2018   Past Surgical History:  Procedure Laterality Date  . APPENDECTOMY  08/21/2006   [facility]. Dr.Sadiq  . COLON RESECTION N/A 09/18/2016   Procedure: LAPAROSCOPIC SIGMOID COLON RESECTION;  Surgeon: Glenna Fellows, MD;  Location: [facility] OR;  Service: General;  Laterality: N/A;  . COLOSTOMY N/A 09/18/2016   Procedure: COLOSTOMY;  Surgeon: Glenna Fellows, MD;  Location: [facility] OR;  Service: General;  Laterality: N/A;  . COLOSTOMY TAKEDOWN N/A 03/19/2017   Procedure: LAPAROSCOPIC TAKEDOWN HARTMANNS COLOSTOMY;  Surgeon: Glenna Fellows, MD;  Location: [facility] ORS;  Service: General;  Laterality: N/A;  . LAPAROSCOPY N/A 09/18/2016   Procedure: DIAGNOSTIC LAPAROSCOPY;  Surgeon: Glenna Fellows, MD;  Location: [facility] OR;  Service: General;  Laterality: N/A;   Family History: family history is not on file. Social History:  reports that she has never smoked. She has never used smokeless tobacco. She reports that she does not drink alcohol or use drugs.     Maternal Diabetes: No Genetic Screening: Normal Maternal Ultrasounds/Referrals: Normal Fetal Ultrasounds or other Referrals:  None Maternal Substance Abuse:  No Significant Maternal Medications:  None Significant Maternal Lab Results:  Group B Strep negative Other Comments:  None  Review of Systems  Constitutional: Negative.   All other systems reviewed and are negative.  Maternal Medical History:  Reason for admission: Contractions.   Contractions: Onset was 1-2 hours ago.   Perceived severity is moderate.     Fetal activity: Perceived fetal activity is normal.   Last perceived fetal movement was within the past hour.    Prenatal complications: no prenatal complications Prenatal Complications - Diabetes: none.    Dilation: 6 Effacement (%): 90 Station: -2 Exam by:: T Lytle RN  Blood pressure 113/75, pulse 84, temperature 98.2 F (36.8 C), temperature source Oral, resp. rate 18, height 5\' 7"  (1.702 m), weight 71.8 kg, SpO2 100 %, currently breastfeeding. Maternal Exam:  Uterine Assessment: Contraction strength is moderate.  Contraction frequency is regular.   Abdomen: Patient reports no abdominal tenderness. Fetal presentation: vertex  Introitus: Normal vulva. Normal vagina.  Ferning test: not done.  Nitrazine test: not done. Amniotic fluid character: not assessed.  Pelvis: adequate for delivery.   Cervix: Cervix evaluated by digital exam.     Physical Exam  Nursing note and vitals reviewed. Constitutional: She is oriented to person, place, and time. She appears well-developed and well-nourished.  HENT:  Head: Normocephalic and atraumatic.  Cardiovascular: Normal rate and regular rhythm.  Respiratory: Effort normal and breath sounds normal.  GI: Soft. Bowel sounds are normal.  Genitourinary:    Vulva, vagina and uterus normal.   Musculoskeletal:        General: Normal range of motion.     Cervical back: Normal range of motion and neck supple.  Neurological: She is  alert and oriented to person, place, and time. She has normal reflexes.  Skin: Skin is warm and dry.  Psychiatric: She has a normal mood and affect.    Prenatal labs: ABO, Rh: O/Positive/-- (10/06 0000) Antibody: Negative (10/06 0000) Rubella: Immune (10/06 0000) RPR: Nonreactive (10/06 0000)  HBsAg: Negative (10/06 0000)  HIV: Non-reactive (10/06 0000)  GBS:   neg  Assessment/Plan: Term IUP Active labor Admit   Hazen Brumett J 01/14/2020, 8:18 PM

## 2020-01-14 NOTE — MAU Note (Signed)
Pt has been contracting all day. Have gotten stronger and closer together. 3-5 mins for past few hours. Has had some type of discharge. Is wearing a panty liner. Good fetal movement. Was 2-3 cm on Wednesday.  IOL on Tuesday.

## 2020-01-14 NOTE — Anesthesia Procedure Notes (Signed)
Epidural Patient location during procedure: OB Start time: 01/14/2020 8:48 PM End time: 01/14/2020 9:03 PM  Staffing Anesthesiologist: Lucretia Kern, MD Performed: anesthesiologist   Preanesthetic Checklist Completed: patient identified, IV checked, risks and benefits discussed, monitors and equipment checked, pre-op evaluation and timeout performed  Epidural Patient position: sitting Prep: DuraPrep Patient monitoring: heart rate, continuous pulse ox and blood pressure Approach: midline Location: L3-L4 Injection technique: LOR air  Needle:  Needle type: Tuohy  Needle gauge: 17 G Needle length: 9 cm Needle insertion depth: 5 cm Catheter type: closed end flexible Catheter size: 19 Gauge Catheter at skin depth: 10 cm Test dose: negative  Assessment Events: blood not aspirated, injection not painful, no injection resistance, no paresthesia and negative IV test  Additional Notes Reason for block:procedure for pain

## 2020-01-15 ENCOUNTER — Other Ambulatory Visit (HOSPITAL_COMMUNITY): Payer: BLUE CROSS/BLUE SHIELD

## 2020-01-15 LAB — CBC
HCT: 32 % — ABNORMAL LOW (ref 36.0–46.0)
Hemoglobin: 10.6 g/dL — ABNORMAL LOW (ref 12.0–15.0)
MCH: 30.3 pg (ref 26.0–34.0)
MCHC: 33.1 g/dL (ref 30.0–36.0)
MCV: 91.4 fL (ref 80.0–100.0)
Platelets: 197 10*3/uL (ref 150–400)
RBC: 3.5 MIL/uL — ABNORMAL LOW (ref 3.87–5.11)
RDW: 12.4 % (ref 11.5–15.5)
WBC: 10.4 10*3/uL (ref 4.0–10.5)
nRBC: 0 % (ref 0.0–0.2)

## 2020-01-15 LAB — RPR: RPR Ser Ql: NONREACTIVE

## 2020-01-15 NOTE — Progress Notes (Signed)
Patient ID: Lauren Zhang, female   DOB: November 19, 1985, 34 y.o.   MRN: [id number] PPD # 1 S/P NSVD  Live born female  Birth Weight: 7 lb 12.7 oz (3535 g) APGAR: 9, 9  Newborn Delivery   Birth date/time: 01/14/2020 21:20:00 Delivery type: Vaginal, Spontaneous     Baby name: undecided Delivering provider: Olivia Mackie  Episiotomy:None   Lacerations:1st degree   Feeding: breast  Pain control at delivery: Epidural   S:  Reports feeling well             Tolerating po/ No nausea or vomiting             Bleeding is light             Pain controlled with ibuprofen (OTC)             Up ad lib / ambulatory / voiding without difficulties   O:  A & O x 3, in no apparent distress              VS:  Vitals:   01/14/20 2250 01/14/20 2305 01/15/20 0030 01/15/20 0457  BP: 107/72 127/76 108/74 111/70  Pulse: 65 (!) 55 74 62  Resp: 18 18 16 14   Temp:  97.9 F (36.6 C) 98.1 F (36.7 C) 97.9 F (36.6 C)  TempSrc:  Oral Oral Oral  SpO2:   98% 98%  Weight:      Height:        LABS:  Recent Labs    01/14/20 2003 01/15/20 0549  WBC 10.4 10.4  HGB 11.6* 10.6*  HCT 35.3* 32.0*  PLT 245 197    Blood type: --/--/O POS (04/25 2000)  Rubella: Immune (10/06 0000)   I&O: I/O last 3 completed shifts: In: -  Out: 154 [Blood:154]          No intake/output data recorded.  Vaccines: TDaP UTD         Flu    UTD   Lungs: Clear and unlabored  Heart: regular rate and rhythm / no murmurs  Abdomen: soft, non-tender, non-distended             Fundus: firm, non-tender, U-2  Perineum: repair intact  Lochia: scant  Extremities: no edema, no calf pain or tenderness    A/P: PPD # 1 34 y.o., 32   Principal Problem:   Postpartum care following vaginal delivery 4/25 Active Problems:   Perineal laceration with delivery, first degree   Doing well - stable status  Routine post partum orders  Anticipate discharge tomorrow    5/25, MSN, CNM 01/15/2020, 8:44 AM

## 2020-01-15 NOTE — Lactation Note (Signed)
This note was copied from a baby's chart. Lactation Consultation Note  Patient Name: Girl Andee Chivers [id number] Date: 01/15/2020 Reason for consult: Follow-up assessment  [facility] student entered room to give parents, breastfeeding booklet.  Baby, and dad were sleep.  Mom was sleep but just woke for a brief second to speak with [facility] student.    [facility] student asked mom if she had any questions.  Mom didn't have any questions or concerns.          Consult Status Consult Status: Follow-up Date: 01/16/20 Follow-up type: In-patient    Yvette Rack. Laquonda Welby 01/15/2020, 2:18 PM

## 2020-01-15 NOTE — Anesthesia Postprocedure Evaluation (Signed)
Anesthesia Post Note  Patient: Lauren Zhang  Procedure(s) Performed: AN AD HOC LABOR EPIDURAL     Patient location during evaluation: Mother Baby Anesthesia Type: Epidural Level of consciousness: awake, awake and alert and oriented Pain management: pain level controlled Vital Signs Assessment: post-procedure vital signs reviewed and stable Respiratory status: spontaneous breathing, nonlabored ventilation and respiratory function stable Cardiovascular status: stable Postop Assessment: no headache, no backache, patient able to bend at knees, no apparent nausea or vomiting, adequate PO intake and able to ambulate Anesthetic complications: no    Last Vitals:  Vitals:   01/15/20 0030 01/15/20 0457  BP: 108/74 111/70  Pulse: 74 62  Resp: 16 14  Temp: 36.7 C 36.6 C  SpO2: 98% 98%    Last Pain:  Vitals:   01/15/20 0457  TempSrc: Oral  PainSc: 0-No pain   Pain Goal: Patients Stated Pain Goal: 0 (01/14/20 2130)                 Santos Sollenberger

## 2020-01-15 NOTE — Lactation Note (Signed)
This note was copied from a baby's chart. Lactation Consultation Note Attempted to see mom, room dark mom sleeping.  Patient Name: Lauren Zhang [id number] Date: 01/15/2020     Maternal Data    Feeding Feeding Type: Breast Fed  LATCH Score                   Interventions    Lactation Tools Discussed/Used     Consult Status      Brennen Gardiner, [name] 01/15/2020, 3:03 AM

## 2020-01-15 NOTE — Lactation Note (Signed)
This note was copied from a baby's chart. Lactation Consultation Note Mom, baby, and significant other still sleeping soundly. Attempted to see mom.   Patient Name: Lauren Zhang [id number] Date: 01/15/2020     Maternal Data    Feeding Feeding Type: Breast Fed  LATCH Score                   Interventions    Lactation Tools Discussed/Used     Consult Status      Lauren Zhang, [name] 01/15/2020, 6:30 AM

## 2020-01-15 NOTE — Lactation Note (Signed)
This note was copied from a baby's chart. Lactation Consultation Note  Patient Name: Lauren Zhang EUMPN'T Date: 01/15/2020 Reason for consult: Initial assessment;Early term 37-38.6wks  [facility] student entered room of G3P2 parent for initial assessment.  [name] is 24 hrs old, born at [redacted]w[redacted]d vaginally.  Baby was not in the room when [facility] student entered.  MOB reported that baby was taken for an ultrasound. MOB reported that she had breast changes during pregnancy.   First baby was BF for about a year.  Only complications mom noted was that baby had difficulty with latching and was syringed fed for a couple of days. MOB has 2-breastpumps at home, [place] and a [place].   MOB reported that BF was going well.  She currently did not have any questions or concerns.  [facility] student reviewed breastfeeding basics with mom.  Encourage mom to continue to do STS as much as possible.   Plan -Feed baby 8-12x w/in a 24 hr period or when baby shows feeding cues. -Continue to do lots of STS -Call lactation next feeding for observation     Consult Status Consult Status: Follow-up Date: 01/15/20 Follow-up type: In-patient    Yvette Rack. Kambrea Carrasco 01/15/2020, 9:54 AM

## 2020-01-16 ENCOUNTER — Inpatient Hospital Stay (HOSPITAL_COMMUNITY): Payer: No Typology Code available for payment source

## 2020-01-16 ENCOUNTER — Inpatient Hospital Stay (HOSPITAL_COMMUNITY)
Admission: AD | Admit: 2020-01-16 | Payer: No Typology Code available for payment source | Source: Home / Self Care | Admitting: Obstetrics and Gynecology

## 2020-01-16 MED ORDER — BENZOCAINE-MENTHOL 20-0.5 % EX AERO: 1.0000 "application " | INHALATION_SPRAY | CUTANEOUS | Status: AC | PRN

## 2020-01-16 MED ORDER — IBUPROFEN 600 MG PO TABS: 600.0000 mg | ORAL_TABLET | Freq: Four times a day (QID) | ORAL | 0 refills | Status: AC

## 2020-01-16 MED ORDER — CVS SLOW RELEASE IRON 143 (45 FE) MG PO TBCR: 1.0000 | EXTENDED_RELEASE_TABLET | Freq: Every day | ORAL | 0 refills | Status: AC

## 2020-01-16 MED ORDER — COCONUT OIL OIL: 1.0000 "application " | TOPICAL_OIL | 0 refills | Status: AC | PRN

## 2020-01-16 NOTE — Lactation Note (Signed)
This note was copied from a baby's chart. Lactation Consultation Note  Patient Name: Lauren Zhang [id number] Date: 01/16/2020 Reason for consult: Follow-up assessment  Upon entering the room, baby was at the breast from a feeding session but had fallen asleep. MOB was taking baby off when [facility] student walked in to check in on how things were going. MOB and FOB had no questions or concerns at this time and have been doing a great job. [facility] student mentioned the amazing amount of pees and poops and MOB was happy to hear that.  [facility] student reminded parents to watch for feeding cues, 8-12x feeds per 24 hours, resources for when they get home can be found in the pamphlet.   [name] 01/16/2020, 8:26 AM

## 2020-01-16 NOTE — Discharge Summary (Signed)
Obstetric Discharge Summary   Patient Name: Lauren Zhang DOB: 1985/12/16 MRN: [id number]  Date of Admission: 01/14/2020 Date of Discharge: 01/16/2020 Date of Delivery: 01/14/2020 Gestational Age at Delivery: [redacted]w[redacted]d  Primary OB: [facility] - Dr. Billy Coast  Antepartum complications:  - hemorrhoids Prenatal Labs:  ABO, Rh: O/Positive/-- (10/06 0000) Antibody: Negative (10/06 0000) Rubella: Immune (10/06 0000) RPR: Nonreactive (10/06 0000)  HBsAg: Negative (10/06 0000)  HIV: Non-reactive (10/06 0000)  GBS:   neg  Admitting Diagnosis: Active labor at term   Secondary Diagnoses: Patient Active Problem List   Diagnosis Date Noted  . Perineal laceration with delivery, first degree 01/15/2020  . Colostomy status (HCC) 03/19/2017  . Postpartum care following vaginal delivery 4/25 12/22/2016  . Second-degree perineal laceration, with delivery 12/22/2016  . Intra-abdominal free air of unknown etiology 09/18/2016    Augmentation: AROM Complications: None  Date of Delivery: 01/14/2020 Delivered By: Dr. Billy Coast Delivery Type: spontaneous vaginal delivery Anesthesia: epidural Placenta: spontaneous Laceration: 1st degree perineal  Episiotomy: none  Newborn Data: Live born female  Birth Weight: 7 lb 12.7 oz (3535 g) APGAR: 9, 9  Newborn Delivery   Birth date/time: 01/14/2020 21:20:00 Delivery type: Vaginal, Spontaneous       Hospital/Postpartum Course  (Vaginal Delivery): Pt. Admitted for labor at term.  She was augmented with AROM and delivered by NSVD.  See notes and delivery summary for details. Patient had an uncomplicated postpartum course.  By time of discharge on PPD#2, her pain was controlled on oral pain medications; she had appropriate lochia and was ambulating, voiding without difficulty and tolerating regular diet.  She was deemed stable for discharge to home.    Labs: CBC Latest Ref Rng & Units 01/15/2020 01/14/2020 03/20/2017  WBC 4.0 - 10.5 K/uL 10.4 10.4 8.7   Hemoglobin 12.0 - 15.0 g/dL 10.6(L) 11.6(L) 10.0(L)  Hematocrit 36.0 - 46.0 % 32.0(L) 35.3(L) 29.1(L)  Platelets 150 - 400 K/uL 197 245 223   O POS  Physical exam:  BP 125/85 (BP Location: Left Arm)   Pulse (!) 57   Temp 97.9 F (36.6 C) (Oral)   Resp 16   Ht 5\' 7"  (1.702 m)   Wt 71.8 kg   SpO2 98%   Breastfeeding Unknown   BMI 24.78 kg/m  General: alert and no distress Pulm: normal respiratory effort Lochia: appropriate Abdomen: soft, NT Uterine Fundus: firm, below umbilicus Perineum: healing well, no significant erythema, no significant edema Extremities: No evidence of DVT seen on physical exam. No lower extremity edema.   Disposition: stable, discharge to home Baby Feeding: breast milk Baby Disposition: home with mom  Contraception: not discussed  Rh Immune globulin given: N/A Rubella vaccine given: N/A Tdap vaccine given in AP or PP setting: UTD Flu vaccine given in AP or PP setting: UTD   Plan:  Lauren Zhang was discharged to home in good condition. Follow-up appointment at [facility] in 6 weeks.  Discharge Instructions: Per After Visit Summary. Refer to After Visit Summary and [facility] discharge booklet  Activity: Advance as tolerated. Pelvic rest for 6 weeks.   Diet: Regular, Heart Healthy Discharge Medications: Allergies as of 01/16/2020   No Known Allergies     Medication List    STOP taking these medications   norethindrone 0.35 MG tablet Commonly known as: MICRONOR     TAKE these medications   acetaminophen 325 MG tablet Commonly known as: Tylenol Take 2 tablets (650 mg total) by mouth every 4 (four) hours as needed (for pain  scale < 4).   benzocaine-Menthol 20-0.5 % Aero Commonly known as: DERMOPLAST Apply 1 application topically as needed for irritation (perineal discomfort).   coconut oil Oil Apply 1 application topically as needed.   [employer] Slow Release Iron 143 (45 Fe) MG Tbcr Generic drug: Ferrous Sulfate Take 1  tablet by mouth daily.   ibuprofen 600 MG tablet Commonly known as: ADVIL Take 1 tablet (600 mg total) by mouth every 6 (six) hours.   prenatal multivitamin Tabs tablet Take 1 tablet by mouth daily.            Discharge Care Instructions  (From admission, onward)         Start     Ordered   01/16/20 0000  Discharge wound care:    Comments: Warm water sitz baths daily as needed   01/16/20 1148         Outpatient follow up:  Follow-up Information    Brien Few, MD. Go in 6 week(s).   Specialty: Obstetrics and Gynecology Why: Postpartum visit Contact information: [place] [address] [phone]           Signed:  Lars Pinks, MSN, CNM [facility]
